# Patient Record
Sex: Female | Born: 1970 | Race: Black or African American | Hispanic: No | Smoking: Never smoker
Health system: Southern US, Community
[De-identification: ages and names within clinical notes are randomized; demographics above are authoritative.]

## PROBLEM LIST (undated history)

## (undated) DIAGNOSIS — M1990 Unspecified osteoarthritis, unspecified site: Secondary | ICD-10-CM

## (undated) DIAGNOSIS — K5900 Constipation, unspecified: Secondary | ICD-10-CM

## (undated) DIAGNOSIS — R42 Dizziness and giddiness: Secondary | ICD-10-CM

## (undated) DIAGNOSIS — F419 Anxiety disorder, unspecified: Secondary | ICD-10-CM

## (undated) DIAGNOSIS — R6889 Other general symptoms and signs: Secondary | ICD-10-CM

## (undated) DIAGNOSIS — G43909 Migraine, unspecified, not intractable, without status migrainosus: Secondary | ICD-10-CM

## (undated) DIAGNOSIS — G5600 Carpal tunnel syndrome, unspecified upper limb: Secondary | ICD-10-CM

## (undated) DIAGNOSIS — R110 Nausea: Secondary | ICD-10-CM

## (undated) DIAGNOSIS — R002 Palpitations: Secondary | ICD-10-CM

## (undated) DIAGNOSIS — R609 Edema, unspecified: Secondary | ICD-10-CM

## (undated) DIAGNOSIS — M722 Plantar fascial fibromatosis: Secondary | ICD-10-CM

## (undated) DIAGNOSIS — K219 Gastro-esophageal reflux disease without esophagitis: Secondary | ICD-10-CM

## (undated) DIAGNOSIS — M549 Dorsalgia, unspecified: Secondary | ICD-10-CM

## (undated) DIAGNOSIS — E7800 Pure hypercholesterolemia, unspecified: Secondary | ICD-10-CM

## (undated) DIAGNOSIS — M21969 Unspecified acquired deformity of unspecified lower leg: Secondary | ICD-10-CM

## (undated) HISTORY — DX: Edema, unspecified: R60.9

## (undated) HISTORY — DX: Pure hypercholesterolemia, unspecified: E78.00

## (undated) HISTORY — DX: Other general symptoms and signs: R68.89

## (undated) HISTORY — PX: ABDOMINAL HYSTERECTOMY: SHX81

## (undated) HISTORY — DX: Palpitations: R00.2

## (undated) HISTORY — DX: Dorsalgia, unspecified: M54.9

## (undated) HISTORY — DX: Dizziness and giddiness: R42

## (undated) HISTORY — DX: Anxiety disorder, unspecified: F41.9

## (undated) HISTORY — DX: Constipation, unspecified: K59.00

## (undated) HISTORY — DX: Gastro-esophageal reflux disease without esophagitis: K21.9

## (undated) HISTORY — DX: Nausea: R11.0

---

## 2006-09-11 ENCOUNTER — Emergency Department (HOSPITAL_COMMUNITY): Admission: EM | Admit: 2006-09-11 | Discharge: 2006-09-11 | Payer: Self-pay | Admitting: Emergency Medicine

## 2010-12-09 ENCOUNTER — Emergency Department (INDEPENDENT_AMBULATORY_CARE_PROVIDER_SITE_OTHER): Payer: Self-pay

## 2010-12-09 ENCOUNTER — Emergency Department (HOSPITAL_BASED_OUTPATIENT_CLINIC_OR_DEPARTMENT_OTHER)
Admission: EM | Admit: 2010-12-09 | Discharge: 2010-12-09 | Disposition: A | Discharge status: Home / Self Care | Payer: Self-pay | Attending: Emergency Medicine | Admitting: Emergency Medicine

## 2010-12-09 ENCOUNTER — Other Ambulatory Visit: Payer: Self-pay

## 2010-12-09 ENCOUNTER — Encounter: Payer: Self-pay | Admitting: *Deleted

## 2010-12-09 DIAGNOSIS — R079 Chest pain, unspecified: Secondary | ICD-10-CM

## 2010-12-09 LAB — BASIC METABOLIC PANEL
BUN: 11 mg/dL (ref 6–23)
CO2: 23 mEq/L (ref 19–32)
Calcium: 9.4 mg/dL (ref 8.4–10.5)
GFR calc non Af Amer: >60 mL/min (ref >60)
Glucose, Bld: 89 mg/dL (ref 70–99)
Potassium: 3.9 mEq/L (ref 3.5–5.1)
Sodium: 138 mEq/L (ref 135–145)

## 2010-12-09 LAB — CBC
Hemoglobin: 12.3 g/dL (ref 12.0–15.0)
MCH: 27.2 pg (ref 26.0–34.0)
MCV: 81.5 fL (ref 78.0–100.0)
RBC: 4.53 MIL/uL (ref 3.87–5.11)
WBC: 7.4 10*3/uL (ref 4.0–10.5)

## 2010-12-09 LAB — DIFFERENTIAL
Eosinophils Absolute: 0.2 10*3/uL (ref 0.0–0.7)
Eosinophils Relative: 3 % (ref 0–5)
Lymphocytes Relative: 34 % (ref 12–46)
Lymphs Abs: 2.5 10*3/uL (ref 0.7–4.0)
Monocytes Relative: 9 % (ref 3–12)

## 2010-12-09 LAB — CK TOTAL AND CKMB (NOT AT ARMC)
CK, MB: 3.1 ng/mL (ref 0.3–4.0)
Total CK: 203 U/L — ABNORMAL HIGH (ref 7–177)

## 2010-12-09 NOTE — ED Provider Notes (Signed)
Medical screening examination/treatment/procedure(s) were performed by non-physician practitioner and as supervising physician I was immediately available for consultation/collaboration.   Geoffery Lyons, MD 12/09/10 2329

## 2010-12-09 NOTE — ED Notes (Signed)
Chest pain x 3 days. Pain comes and goes. Sometimes it is in her right chest then it goes to her left chest. Pain has been in both arms but not at the same time. States she has been taking diet pills and has been under a lot of stress.

## 2010-12-09 NOTE — ED Provider Notes (Signed)
History     CSN: [id number] Arrival date & time: 12/09/2010  9:08 PM  Chief Complaint  Patient presents with  . Chest Pain   HPI Comments: Pt states that the pain has varied in location from the right chest to left chest to back am bilateral upper arms intermittent:pain was not radiating, wherever it was at the time was only in that location:pt states that she has been using her phentermine consistently over the last week which she doesn't normally do, she takes it intermittently;pt states that she also has been under a lot of stress with her so  Patient is a 40 y.o. female presenting with chest pain. The history is provided by the patient. No language interpreter was used.  Chest Pain The chest pain began 3 - 5 days ago. Chest pain occurs intermittently. The chest pain is unchanged. The pain is associated with stress. The severity of the pain is moderate. The quality of the pain is described as aching. The pain does not radiate. Chest pain is worsened by stress and deep breathing. Pertinent negatives for primary symptoms include no fever, no cough, no palpitations, no abdominal pain, no vomiting and no dizziness.  Pertinent negatives for associated symptoms include no near-syncope. She tried aspirin for the symptoms.     History reviewed. No pertinent past medical history.  Past Surgical History  Procedure Date  . Abdominal hysterectomy     No family history on file.  History  Substance Use Topics  . Smoking status: Never Smoker   . Smokeless tobacco: Not on file  . Alcohol Use: No    OB History    Grav Para Term Preterm Abortions TAB SAB Ect Mult Living                  Review of Systems  Constitutional: Negative for fever.  Respiratory: Negative for cough.   Cardiovascular: Positive for chest pain. Negative for palpitations and near-syncope.  Gastrointestinal: Negative for vomiting and abdominal pain.  Neurological: Negative for dizziness.  All other systems reviewed  and are negative.    Physical Exam  BP 137/95  Pulse 72  Temp(Src) 97.7 F (36.5 C) (Oral)  Resp 22  SpO2 99%  Physical Exam  Nursing note and vitals reviewed. Constitutional: She is oriented to person, place, and time. She appears well-developed and well-nourished.  HENT:  Head: Normocephalic and atraumatic.  Eyes: Pupils are equal, round, and reactive to light.  Neck: Normal range of motion. Neck supple.  Cardiovascular: Normal rate and regular rhythm.   Pulmonary/Chest: Effort normal and breath sounds normal.  Abdominal: Soft. Bowel sounds are normal.  Musculoskeletal: Normal range of motion.  Neurological: She is alert and oriented to person, place, and time.  Skin: Skin is warm and dry.  Psychiatric: She has a normal mood and affect.    ED Course  Procedures  Results for orders placed during the hospital encounter of 12/09/10  CBC      Component Value Range   WBC 7.4  4.0 - 10.5 (K/uL)   RBC 4.53  3.87 - 5.11 (MIL/uL)   Hemoglobin 12.3  12.0 - 15.0 (g/dL)   HCT 56.2  13.0 - 86.5 (%)   MCV 81.5  78.0 - 100.0 (fL)   MCH 27.2  26.0 - 34.0 (pg)   MCHC 33.3  30.0 - 36.0 (g/dL)   RDW 78.4  69.6 - 29.5 (%)   Platelets 301  150 - 400 (K/uL)  DIFFERENTIAL  Component Value Range   Neutrophils Relative 53  43 - 77 (%)   Neutro Abs 3.9  1.7 - 7.7 (K/uL)   Lymphocytes Relative 34  12 - 46 (%)   Lymphs Abs 2.5  0.7 - 4.0 (K/uL)   Monocytes Relative 9  3 - 12 (%)   Monocytes Absolute 0.6  0.1 - 1.0 (K/uL)   Eosinophils Relative 3  0 - 5 (%)   Eosinophils Absolute 0.2  0.0 - 0.7 (K/uL)   Basophils Relative 1  0 - 1 (%)   Basophils Absolute 0.1  0.0 - 0.1 (K/uL)  BASIC METABOLIC PANEL      Component Value Range   Sodium 138  135 - 145 (mEq/L)   Potassium 3.9  3.5 - 5.1 (mEq/L)   Chloride 103  96 - 112 (mEq/L)   CO2 23  19 - 32 (mEq/L)   Glucose, Bld 89  70 - 99 (mg/dL)   BUN 11  6 - 23 (mg/dL)   Creatinine, Ser 1.61  0.50 - 1.10 (mg/dL)   Calcium 9.4  8.4 -  10.5 (mg/dL)   GFR calc non Af Amer >60  >60 (mL/min)   GFR calc Af Amer >60  >60 (mL/min)  TROPONIN I      Component Value Range   Troponin I <0.30  <0.30 (ng/mL)  CK TOTAL AND CKMB      Component Value Range   Total CK 203 (*) 7 - 177 (U/L)   CK, MB 3.1  0.3 - 4.0 (ng/mL)   Relative Index 1.5  0.0 - 2.5    Dg Chest 2 View  12/09/2010  *RADIOLOGY REPORT*  Clinical Data: Left upper chest pain for 4 days.  CHEST - 2 VIEW  Comparison: None.  Findings: Heart size is normal.  The lungs are free of focal consolidations and pleural effusions.  No pulmonary edema. Visualized osseous structures have a normal appearance.  IMPRESSION: Negative exam.  Original Report Authenticated By: Patterson Hammersmith, M.D.    Date: 12/09/2010  Rate: 76  Rhythm: normal sinus rhythm  QRS Axis: normal  Intervals: normal  ST/T Wave abnormalities: normal  Conduction Disutrbances:none  Narrative Interpretation:   Old EKG Reviewed: non available    MDM Pt no hypoxic or tachycardic unlikely a pe:no sign of pneumonia on x-ray:pt has no family history:history not concerning for WRU:EAVW send pt home with follow up as need:told pt that phentermine can make symptoms worse:pts son is supposed to go court tomorrow       Teressa Lower, NP 12/09/10 2304

## 2010-12-11 ENCOUNTER — Emergency Department (HOSPITAL_BASED_OUTPATIENT_CLINIC_OR_DEPARTMENT_OTHER)
Admission: EM | Admit: 2010-12-11 | Discharge: 2010-12-11 | Disposition: A | Discharge status: Home / Self Care | Payer: Self-pay | Attending: Emergency Medicine | Admitting: Emergency Medicine

## 2010-12-11 ENCOUNTER — Encounter (HOSPITAL_BASED_OUTPATIENT_CLINIC_OR_DEPARTMENT_OTHER): Payer: Self-pay | Admitting: *Deleted

## 2010-12-11 DIAGNOSIS — T148XXA Other injury of unspecified body region, initial encounter: Secondary | ICD-10-CM

## 2010-12-11 DIAGNOSIS — Y849 Medical procedure, unspecified as the cause of abnormal reaction of the patient, or of later complication, without mention of misadventure at the time of the procedure: Secondary | ICD-10-CM | POA: Insufficient documentation

## 2010-12-11 DIAGNOSIS — S5010XA Contusion of unspecified forearm, initial encounter: Secondary | ICD-10-CM | POA: Insufficient documentation

## 2010-12-11 NOTE — ED Provider Notes (Signed)
History     CSN: [id number] Arrival date & time: 12/11/2010 11:15 PM  Chief Complaint  Patient presents with  . Arm Injury   Patient is a 40 y.o. female presenting with arm injury. The history is provided by the patient.  Arm Injury  Episode onset: 2 days ago, had an IV placed here for blood draw and  now has bruising in that area. Incident location: in the ER. The injury mechanism is unknown. There is an injury to the right forearm. The patient is experiencing no pain. It is unlikely that a foreign body is present. Pertinent negatives include no chest pain, no numbness, no abdominal pain, no headaches and no neck pain. There have been no prior injuries to these areas. Her tetanus status is UTD.   No redness or warmth, no N/V, no other complaints, was worried when she bruising.  History reviewed. No pertinent past medical history.  Past Surgical History  Procedure Date  . Abdominal hysterectomy     History reviewed. No pertinent family history.  History  Substance Use Topics  . Smoking status: Never Smoker   . Smokeless tobacco: Not on file  . Alcohol Use: No    OB History    Grav Para Term Preterm Abortions TAB SAB Ect Mult Living                  Review of Systems  Constitutional: Negative for fever and chills.  HENT: Negative for neck pain and neck stiffness.   Eyes: Negative for pain.  Respiratory: Negative for shortness of breath.   Cardiovascular: Negative for chest pain and leg swelling.  Gastrointestinal: Negative for abdominal pain.  Genitourinary: Negative for dysuria.  Musculoskeletal: Negative for back pain.  Skin: Negative for rash.  Neurological: Negative for numbness and headaches.  All other systems reviewed and are negative.    Physical Exam  BP 128/89  Pulse 90  Temp(Src) 98 F (36.7 C) (Oral)  Resp 16  Wt 212 lb (96.163 kg)  SpO2 100%  Physical Exam  Constitutional: She is oriented to person, place, and time. She appears well-developed and  well-nourished.  HENT:  Head: Normocephalic and atraumatic.  Eyes: Conjunctivae and EOM are normal. Pupils are equal, round, and reactive to light.  Neck: Trachea normal. Neck supple. No thyromegaly present.  Cardiovascular: Normal rate, regular rhythm, S1 normal, S2 normal and normal pulses.     No systolic murmur is present   No diastolic murmur is present  Pulses:      Radial pulses are 2+ on the right side, and 2+ on the left side.  Pulmonary/Chest: Effort normal and breath sounds normal. She has no wheezes. She has no rhonchi. She has no rales. She exhibits no tenderness.  Abdominal: Soft. Normal appearance and bowel sounds are normal. There is no tenderness. There is no CVA tenderness and negative Murphy's sign.  Musculoskeletal:       RUE: ecchymosis to proximal FA, no erythema or warmth or evidence of phlebitis otherwise. NTTP. Distal N/V intact. FROM throughout, no lymphadenopathy   Neurological: She is alert and oriented to person, place, and time. She has normal strength. No cranial nerve deficit or sensory deficit. GCS eye subscore is 4. GCS verbal subscore is 5. GCS motor subscore is 6.  Skin: Skin is warm and dry. No rash noted. She is not diaphoretic.  Psychiatric: Her speech is normal.       Cooperative and appropriate    ED Course  Procedures  MDM R FA ecchymosis s/p IV blood draw c/w bruising, not c/w phlebitis. Reassurance and instructions provided.       Sunnie Nielsen, MD 12/12/10 [phone]

## 2010-12-11 NOTE — ED Notes (Signed)
Pt seen here Monday and return today with bruise to right arm where iv was

## 2013-06-17 ENCOUNTER — Encounter (INDEPENDENT_AMBULATORY_CARE_PROVIDER_SITE_OTHER): Payer: Self-pay | Admitting: Ophthalmology

## 2013-07-08 ENCOUNTER — Encounter (INDEPENDENT_AMBULATORY_CARE_PROVIDER_SITE_OTHER): Payer: Managed Care, Other (non HMO) | Admitting: Ophthalmology

## 2013-07-08 DIAGNOSIS — G43919 Migraine, unspecified, intractable, without status migrainosus: Secondary | ICD-10-CM

## 2013-07-08 DIAGNOSIS — H43819 Vitreous degeneration, unspecified eye: Secondary | ICD-10-CM

## 2013-07-08 DIAGNOSIS — H3540 Unspecified peripheral retinal degeneration: Secondary | ICD-10-CM

## 2014-01-09 ENCOUNTER — Other Ambulatory Visit: Payer: Self-pay | Admitting: Internal Medicine

## 2014-01-09 DIAGNOSIS — N644 Mastodynia: Secondary | ICD-10-CM

## 2014-06-13 ENCOUNTER — Encounter: Payer: Self-pay | Admitting: Podiatry

## 2014-06-13 ENCOUNTER — Ambulatory Visit (INDEPENDENT_AMBULATORY_CARE_PROVIDER_SITE_OTHER): Payer: Managed Care, Other (non HMO) | Admitting: Podiatry

## 2014-06-13 VITALS — BP 124/78 | HR 76 | Ht 60.0 in | Wt 223.0 lb

## 2014-06-13 DIAGNOSIS — M21969 Unspecified acquired deformity of unspecified lower leg: Secondary | ICD-10-CM | POA: Insufficient documentation

## 2014-06-13 DIAGNOSIS — B351 Tinea unguium: Secondary | ICD-10-CM

## 2014-06-13 DIAGNOSIS — B353 Tinea pedis: Secondary | ICD-10-CM | POA: Insufficient documentation

## 2014-06-13 DIAGNOSIS — M722 Plantar fascial fibromatosis: Secondary | ICD-10-CM | POA: Diagnosis not present

## 2014-06-13 NOTE — Progress Notes (Signed)
Subjective: 44 year old female presents complaining of sharp pain in left heel in the morning or after been on feet long time, toe fungus, occasional athletic feet, sharp pain in big toe may be from fungal nail.  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   Review of Systems - General ROS: Feels cold or chill all the time. Ophthalmic ROS: Seeing white circles. Been checked out. Possible from Migranes. ENT ROS: negative Allergy and Immunology ROS: Hay fever. Hematological and Lymphatic ROS: negative Breast ROS: negative for breast lumps Respiratory ROS: no cough, shortness of breath, or wheezing Cardiovascular ROS: no chest pain or dyspnea on exertion Gastrointestinal ROS: no abdominal pain, change in bowel habits, or black or bloody stools Genito-Urinary ROS: no dysuria, trouble voiding, or hematuria Musculoskeletal ROS: Sore on thighs after been sitting from driving.  Neurological ROS: Hands get numb at times. Dermatological ROS: Dry feeling skin with fungal nails.    Objective: Dermatologic: Thick discolored nails x 10. Loose nail both great toes.  Hallux valgus bilateral. Hypermobile first ray bilateral L>R. STJ pronation bilateral.   Assessment: Onychomycosis x 10 with deformity. Hypermobile first ray with STJ hyperpronation bilateral. Bilateral bunion.  Plantar fasciitis left.  Tinea pedis mild.   Plan: Reviewed clinical findings and available treatment options. Night Splint dispensed. May give cortisone injection on next visit if needed. Need custom orthotics to reduce excess pronation. Use Salsun blue soap to scrub feet at the end of each shower. Return for orthotic.

## 2014-06-13 NOTE — Patient Instructions (Signed)
Seen for painful heel left foot. Night Splint dispensed. Need custom orthotics. Use Salsun blue soap to scrub feet at the end of each shower. Return for orthotic.

## 2014-06-27 ENCOUNTER — Ambulatory Visit: Payer: Managed Care, Other (non HMO) | Admitting: Podiatry

## 2014-07-07 ENCOUNTER — Ambulatory Visit: Payer: Managed Care, Other (non HMO) | Admitting: Podiatry

## 2014-08-26 ENCOUNTER — Encounter (HOSPITAL_BASED_OUTPATIENT_CLINIC_OR_DEPARTMENT_OTHER): Payer: Self-pay | Admitting: *Deleted

## 2014-08-26 ENCOUNTER — Emergency Department (HOSPITAL_BASED_OUTPATIENT_CLINIC_OR_DEPARTMENT_OTHER)
Admission: EM | Admit: 2014-08-26 | Discharge: 2014-08-26 | Disposition: A | Discharge status: Home / Self Care | Payer: Managed Care, Other (non HMO) | Attending: Emergency Medicine | Admitting: Emergency Medicine

## 2014-08-26 DIAGNOSIS — Z79899 Other long term (current) drug therapy: Secondary | ICD-10-CM | POA: Insufficient documentation

## 2014-08-26 DIAGNOSIS — K088 Other specified disorders of teeth and supporting structures: Secondary | ICD-10-CM | POA: Diagnosis not present

## 2014-08-26 DIAGNOSIS — K0889 Other specified disorders of teeth and supporting structures: Secondary | ICD-10-CM

## 2014-08-26 MED ORDER — AMOXICILLIN 500 MG PO CAPS: 500.0000 mg | ORAL_CAPSULE | Freq: Three times a day (TID) | ORAL | Status: DC

## 2014-08-26 MED ORDER — HYDROCODONE-ACETAMINOPHEN 5-325 MG PO TABS: ORAL_TABLET | ORAL | Status: DC

## 2014-08-26 MED ORDER — AMOXICILLIN 500 MG PO CAPS
500.0000 mg | ORAL_CAPSULE | Freq: Once | ORAL | Status: AC
  Administered 2014-08-26: 500 mg via ORAL
  Filled 2014-08-26: qty 1

## 2014-08-26 NOTE — ED Notes (Signed)
Right upper dental pain x1 month.

## 2014-08-26 NOTE — Discharge Instructions (Signed)
Take vicodin for breakthrough pain, do not drink alcohol, drive, care for children or do other critical tasks while taking vicodin.  Return to the emergency room for fever, change in vision, redness to the face that rapidly spreads towards the eye, nausea or vomiting, difficulty swallowing or shortness of breath.  Take your antibiotics as directed and to the end of the course.   Followup with a dentist is very important for ongoing evaluation and management of recurrent dental pain. Return to emergency department for emergent changing or worsening symptoms."    Dental Pain A tooth ache may be caused by cavities (tooth decay). Cavities expose the nerve of the tooth to air and hot or cold temperatures. It may come from an infection or abscess (also called a boil or furuncle) around your tooth. It is also often caused by dental caries (tooth decay). This causes the pain you are having. DIAGNOSIS  Your caregiver can diagnose this problem by exam. TREATMENT   If caused by an infection, it may be treated with medications which kill germs (antibiotics) and pain medications as prescribed by your caregiver. Take medications as directed.  Only take over-the-counter or prescription medicines for pain, discomfort, or fever as directed by your caregiver.  Whether the tooth ache today is caused by infection or dental disease, you should see your dentist as soon as possible for further care. SEEK MEDICAL CARE IF: The exam and treatment you received today has been provided on an emergency basis only. This is not a substitute for complete medical or dental care. If your problem worsens or new problems (symptoms) appear, and you are unable to meet with your dentist, call or return to this location. SEEK IMMEDIATE MEDICAL CARE IF:   You have a fever.  You develop redness and swelling of your face, jaw, or neck.  You are unable to open your mouth.  You have severe pain uncontrolled by pain medicine. MAKE  SURE YOU:   Understand these instructions.  Will watch your condition.  Will get help right away if you are not doing well or get worse. Document Released: 03/24/2005 Document Revised: 06/16/2011 Document Reviewed: 11/10/2007 [facility] Patient Information 2015 [employer], [place]. This information is not intended to replace advice given to you by your health care provider. Make sure you discuss any questions you have with your health care provider.

## 2014-08-26 NOTE — ED Provider Notes (Signed)
CSN: [id number]     Arrival date & time 08/26/14  1846 History   First MD Initiated Contact with Patient 08/26/14 1917     Chief Complaint  Patient presents with  . Dental Pain     (Consider location/radiation/quality/duration/timing/severity/associated sxs/prior Treatment) HPI   Marissa Lin is a 44 y.o. female complaining of persistent pain to right upper jaw status post extraction approximately one month ago. Patient states that she is sensitive to cold, she saw her dentist, Dr. Clarene Duke last week where she had a tooth extracted on the bottom, states she's feeling well from this. She rates her pain at 8 out of 10, exacerbated by hot and cold. She's taking tramadol home with little relief. She denies fever, chills, facial swelling, difficulty swallowing.   History reviewed. No pertinent past medical history. Past Surgical History  Procedure Laterality Date  . Abdominal hysterectomy     No family history on file. History  Substance Use Topics  . Smoking status: Never Smoker   . Smokeless tobacco: Never Used  . Alcohol Use: No   OB History    No data available     Review of Systems  10 systems reviewed and found to be negative, except as noted in the HPI.  Allergies  Fluconazole in dextrose  Home Medications   Prior to Admission medications   Medication Sig Start Date End Date Taking? Authorizing Provider  amoxicillin (AMOXIL) 500 MG capsule Take 1 capsule (500 mg total) by mouth 3 (three) times daily. 08/26/14   Marissa Kachel, PA-C  ASPIRIN BUFFERED PO Take 1 tablet by mouth once as needed. For chest pain     Historical Provider, MD  HYDROcodone-acetaminophen (NORCO/VICODIN) 5-325 MG per tablet Take 1-2 tablets by mouth every 6 hours as needed for pain and/or cough. 08/26/14   Marissa Finazzo, PA-C  phentermine 37.5 MG capsule Take 37.5 mg by mouth every morning.      Historical Provider, MD   BP 148/91 mmHg  Pulse 80  Temp(Src) 98.3 F (36.8 C) (Oral)   Resp 18  Ht 5' (1.524 m)  Wt 220 lb (99.791 kg)  BMI 42.97 kg/m2  SpO2 100% Physical Exam  Constitutional: She is oriented to person, place, and time. She appears well-developed and well-nourished. No distress.  HENT:  Head: Normocephalic.  Mouth/Throat: Uvula is midline and oropharynx is clear and moist. No trismus in the jaw. No uvula swelling. No oropharyngeal exudate, posterior oropharyngeal edema, posterior oropharyngeal erythema or tonsillar abscesses.  Generally poor dentition, no gingival swelling, erythema or tenderness to palpation. Site of extraction is healed nicely, there is trace superficial erythema to the site, no fluctuance or significant tenderness to palpation. Patient is handling their secretions. There is no tenderness to palpation or firmness underneath tongue bilaterally. No trismus.    Eyes: Conjunctivae and EOM are normal.  Cardiovascular: Normal rate.   Pulmonary/Chest: Effort normal. No stridor.  Musculoskeletal: Normal range of motion.  Lymphadenopathy:    She has no cervical adenopathy.  Neurological: She is alert and oriented to person, place, and time.  Psychiatric: She has a normal mood and affect.  Nursing note and vitals reviewed.   ED Course  Procedures (including critical care time) Labs Review Labs Reviewed - No data to display  Imaging Review No results found.   EKG Interpretation None      MDM   Final diagnoses:  Pain, dental    Filed Vitals:   08/26/14 1855  BP: 148/91  Pulse: 80  Temp:  98.3 F (36.8 C)  TempSrc: Oral  Resp: 18  Height: 5' (1.524 m)  Weight: 220 lb (99.791 kg)  SpO2: 100%    Medications  amoxicillin (AMOXIL) capsule 500 mg (not administered)    Marissa Lin is a pleasant 44 y.o. female presenting with persistent dental discomfort after tooth was extracted approximately one month ago. There is no overt signs of infection, is healed well., Will start on amoxicillin for possible early  infection. Advised her to follow closely with her dentist.  Evaluation does not show pathology that would require ongoing emergent intervention or inpatient treatment. Pt is hemodynamically stable and mentating appropriately. Discussed findings and plan with patient/guardian, who agrees with care plan. All questions answered. Return precautions discussed and outpatient follow up given.   New Prescriptions   AMOXICILLIN (AMOXIL) 500 MG CAPSULE    Take 1 capsule (500 mg total) by mouth 3 (three) times daily.   HYDROCODONE-ACETAMINOPHEN (NORCO/VICODIN) 5-325 MG PER TABLET    Take 1-2 tablets by mouth every 6 hours as needed for pain and/or cough.         Marissa Emeryicole Raffael Bugarin, PA-C 08/26/14 1935  Arby BarretteMarcy Pfeiffer, MD 08/29/14 1435

## 2015-02-09 ENCOUNTER — Emergency Department (HOSPITAL_BASED_OUTPATIENT_CLINIC_OR_DEPARTMENT_OTHER): Payer: Managed Care, Other (non HMO)

## 2015-02-09 ENCOUNTER — Emergency Department (HOSPITAL_BASED_OUTPATIENT_CLINIC_OR_DEPARTMENT_OTHER)
Admission: EM | Admit: 2015-02-09 | Discharge: 2015-02-09 | Disposition: A | Discharge status: Home / Self Care | Payer: Managed Care, Other (non HMO) | Attending: Emergency Medicine | Admitting: Emergency Medicine

## 2015-02-09 ENCOUNTER — Encounter (HOSPITAL_BASED_OUTPATIENT_CLINIC_OR_DEPARTMENT_OTHER): Payer: Self-pay | Admitting: *Deleted

## 2015-02-09 DIAGNOSIS — Z792 Long term (current) use of antibiotics: Secondary | ICD-10-CM | POA: Diagnosis not present

## 2015-02-09 DIAGNOSIS — Z79899 Other long term (current) drug therapy: Secondary | ICD-10-CM | POA: Diagnosis not present

## 2015-02-09 DIAGNOSIS — S6992XA Unspecified injury of left wrist, hand and finger(s), initial encounter: Secondary | ICD-10-CM | POA: Insufficient documentation

## 2015-02-09 DIAGNOSIS — S3992XA Unspecified injury of lower back, initial encounter: Secondary | ICD-10-CM | POA: Diagnosis not present

## 2015-02-09 DIAGNOSIS — W1839XA Other fall on same level, initial encounter: Secondary | ICD-10-CM | POA: Insufficient documentation

## 2015-02-09 DIAGNOSIS — F419 Anxiety disorder, unspecified: Secondary | ICD-10-CM | POA: Insufficient documentation

## 2015-02-09 DIAGNOSIS — Y998 Other external cause status: Secondary | ICD-10-CM | POA: Insufficient documentation

## 2015-02-09 DIAGNOSIS — Y9389 Activity, other specified: Secondary | ICD-10-CM | POA: Insufficient documentation

## 2015-02-09 DIAGNOSIS — M79645 Pain in left finger(s): Secondary | ICD-10-CM

## 2015-02-09 DIAGNOSIS — R21 Rash and other nonspecific skin eruption: Secondary | ICD-10-CM | POA: Insufficient documentation

## 2015-02-09 DIAGNOSIS — M549 Dorsalgia, unspecified: Secondary | ICD-10-CM

## 2015-02-09 DIAGNOSIS — Y9289 Other specified places as the place of occurrence of the external cause: Secondary | ICD-10-CM | POA: Insufficient documentation

## 2015-02-09 MED ORDER — TRIAMCINOLONE ACETONIDE 0.1 % EX CREA: 1.0000 "application " | TOPICAL_CREAM | Freq: Two times a day (BID) | CUTANEOUS | Status: DC

## 2015-02-09 MED ORDER — METHOCARBAMOL 500 MG PO TABS: 500.0000 mg | ORAL_TABLET | Freq: Two times a day (BID) | ORAL | Status: DC

## 2015-02-09 MED ORDER — NAPROXEN 500 MG PO TABS: 500.0000 mg | ORAL_TABLET | Freq: Two times a day (BID) | ORAL | Status: DC

## 2015-02-09 NOTE — Discharge Instructions (Signed)
Take the prescribed medication as directed. °Follow-up with your primary care physician. °Return to the ED for new or worsening symptoms. ° °

## 2015-02-09 NOTE — ED Notes (Signed)
Pt c/o bilateral lower back pain, "bumps" on bilateral cheeks with itching and left thumb pain.  Pt additionally states her right arm intermittently "goes numb" for past few months.

## 2015-02-09 NOTE — ED Notes (Signed)
Back pain x 2 weeks. She fell and had an injury to her left thumb a few weeks ago. Pain continues. Her face itches and she feels like something is crawling on her face.

## 2015-02-09 NOTE — ED Notes (Signed)
Patient transported to X-ray 

## 2015-02-09 NOTE — ED Provider Notes (Signed)
CSN: [id number]     Arrival date & time 02/09/15  2005 History   First MD Initiated Contact with Patient 02/09/15 2057     Chief Complaint  Patient presents with  . Back Pain     (Consider location/radiation/quality/duration/timing/severity/associated sxs/prior Treatment) Patient is a 44 y.o. female presenting with back pain. The history is provided by the patient and medical records.  Back Pain   44 year old female with no significant past medical history presenting to the ED for multiple complaints. Patient states she fell proximally 2-3 weeks ago onto her low back and left thumb. She states she has continued to have pain in these areas. States she feels there is a "catch" in her low back. She denies numbness or weakness of her lower extremities. No loss of bowel or bladder control. Patient also complains of a pruritic rash of her cheeks. She states this waxes and wanes. She denies any new soap, detergent, makeup, or other personal care products. She states she does have dry skin. No history of eczema. She states she feels as though there is "something crawling on her face". She denies any fever or chills. No facial/oral swelling or difficulty swallowing.  History reviewed. No pertinent past medical history. Past Surgical History  Procedure Laterality Date  . Abdominal hysterectomy     No family history on file. Social History  Substance Use Topics  . Smoking status: Never Smoker   . Smokeless tobacco: Never Used  . Alcohol Use: No   OB History    No data available     Review of Systems  Musculoskeletal: Positive for back pain and arthralgias.  Skin: Positive for rash.  All other systems reviewed and are negative.     Allergies  Fluconazole in dextrose  Home Medications   Prior to Admission medications   Medication Sig Start Date End Date Taking? Authorizing Provider  amoxicillin (AMOXIL) 500 MG capsule Take 1 capsule (500 mg total) by mouth 3 (three) times daily.  08/26/14   Nicole Pisciotta, PA-C  ASPIRIN BUFFERED PO Take 1 tablet by mouth once as needed. For chest pain     Historical Provider, MD  HYDROcodone-acetaminophen (NORCO/VICODIN) 5-325 MG per tablet Take 1-2 tablets by mouth every 6 hours as needed for pain and/or cough. 08/26/14   Nicole Pisciotta, PA-C  phentermine 37.5 MG capsule Take 37.5 mg by mouth every morning.      Historical Provider, MD   BP 158/101 mmHg  Pulse 88  Temp(Src) 98 F (36.7 C) (Oral)  Resp 18  Ht 5' (1.524 m)  Wt 220 lb (99.791 kg)  BMI 42.97 kg/m2  SpO2 100%   Physical Exam  Constitutional: She is oriented to person, place, and time. She appears well-developed and well-nourished. No distress.  HENT:  Head: Normocephalic and atraumatic.  Mouth/Throat: Oropharynx is clear and moist.  No oral lesions, airway patent, handling secretions well  Eyes: Conjunctivae and EOM are normal. Pupils are equal, round, and reactive to light.  Neck: Normal range of motion.  Cardiovascular: Normal rate, regular rhythm and normal heart sounds.   Pulmonary/Chest: Effort normal and breath sounds normal. No respiratory distress. She has no wheezes.  Abdominal: Soft. Bowel sounds are normal.  Musculoskeletal: Normal range of motion.       Left hand: Normal. Normal sensation noted. Normal strength noted.  Endorses diffuse pain across lumbar spine without focal tenderness; no midline deformities or step-off; full ROM maintained; negative straight leg raise bilaterally; normal strength and sensation of bilateral  lower extremities, normal gait Reports left thumb "needs to pop"-- no deformities, non-tender, full ROM  Neurological: She is alert and oriented to person, place, and time.  Skin: Skin is warm and dry. Rash noted. She is not diaphoretic.  Few scattered pruritic areas noted to upper cheeks extending towards nose; no signs of superimposed infection or cellulitis  Psychiatric: Her mood appears anxious.  Appear anxious  Nursing  note and vitals reviewed.   ED Course  Procedures (including critical care time) Labs Review Labs Reviewed - No data to display  Imaging Review Dg Lumbar Spine Complete  02/09/2015  CLINICAL DATA:  Worsening low back pain for the past year. Fell 3 weeks ago. EXAM: LUMBAR SPINE - COMPLETE 4+ VIEW COMPARISON:  None. FINDINGS: Bilateral pelvic and left lower abdominal phleboliths. 5 non-rib-bearing lumbar vertebrae. Minimal anterior spur formation at the L4-5 level. No fractures, pars defects or subluxations. IMPRESSION: No fracture or subluxation. Minimal degenerative changes at the L4-5 level. Electronically Signed   By: Beckie Salts M.D.   On: 02/09/2015 22:03   I have personally reviewed and evaluated these images and lab results as part of my medical decision-making.   EKG Interpretation None      MDM   Final diagnoses:  Back pain, unspecified location  Thumb pain, left  Rash   44 year old female here for multiple complaints. She reports left thumb pain and low back pain after a fall 2 weeks ago. She has no noted deformities on exam. No focal neurologic deficits to suggest cauda equina.  X-rays were obtained which are negative for acute findings.  Will start her on Robaxin and Naprosyn. Patient also complains of pruritic rash on her face.  Does admit to dry skin.  Rash does not appear infectious and is not consistent with malar rash of lupus.  May be component of eczema given her dry skin.  Will start on trial of kenalog.  Patient to follow-up with PCP.  Discussed plan with patient, he/she acknowledged understanding and agreed with plan of care.  Return precautions given for new or worsening symptoms.  Garlon Hatchet, PA-C 02/09/15 2210  Linwood Dibbles, MD 02/09/15 2216

## 2015-04-11 ENCOUNTER — Other Ambulatory Visit: Payer: Self-pay

## 2015-04-11 ENCOUNTER — Emergency Department (HOSPITAL_BASED_OUTPATIENT_CLINIC_OR_DEPARTMENT_OTHER): Payer: Managed Care, Other (non HMO)

## 2015-04-11 ENCOUNTER — Encounter (HOSPITAL_BASED_OUTPATIENT_CLINIC_OR_DEPARTMENT_OTHER): Payer: Self-pay | Admitting: *Deleted

## 2015-04-11 ENCOUNTER — Emergency Department (HOSPITAL_BASED_OUTPATIENT_CLINIC_OR_DEPARTMENT_OTHER)
Admission: EM | Admit: 2015-04-11 | Discharge: 2015-04-11 | Disposition: A | Discharge status: Home / Self Care | Payer: Managed Care, Other (non HMO) | Attending: Emergency Medicine | Admitting: Emergency Medicine

## 2015-04-11 DIAGNOSIS — N644 Mastodynia: Secondary | ICD-10-CM | POA: Diagnosis not present

## 2015-04-11 DIAGNOSIS — R002 Palpitations: Secondary | ICD-10-CM | POA: Diagnosis not present

## 2015-04-11 DIAGNOSIS — R079 Chest pain, unspecified: Secondary | ICD-10-CM | POA: Diagnosis not present

## 2015-04-11 LAB — CBC WITH DIFFERENTIAL/PLATELET
BASOS PCT: 1 %
Basophils Absolute: 0 10*3/uL (ref 0.0–0.1)
EOS ABS: 0.2 10*3/uL (ref 0.0–0.7)
EOS PCT: 3 %
HCT: 36.2 % (ref 36.0–46.0)
HEMOGLOBIN: 11.6 g/dL — AB (ref 12.0–15.0)
LYMPHS ABS: 1.9 10*3/uL (ref 0.7–4.0)
Lymphocytes Relative: 35 %
MCH: 26.4 pg (ref 26.0–34.0)
MCHC: 32 g/dL (ref 30.0–36.0)
MCV: 82.5 fL (ref 78.0–100.0)
MONO ABS: 0.5 10*3/uL (ref 0.1–1.0)
MONOS PCT: 10 %
NEUTROS PCT: 51 %
Neutro Abs: 2.7 10*3/uL (ref 1.7–7.7)
Platelets: 359 10*3/uL (ref 150–400)
RBC: 4.39 MIL/uL (ref 3.87–5.11)
RDW: 14.9 % (ref 11.5–15.5)
WBC: 5.3 10*3/uL (ref 4.0–10.5)

## 2015-04-11 LAB — MAGNESIUM: MAGNESIUM: 2.1 mg/dL (ref 1.7–2.4)

## 2015-04-11 LAB — D-DIMER, QUANTITATIVE: D-Dimer, Quant: 0.31 ug{FEU}/mL (ref 0.00–0.50)

## 2015-04-11 LAB — PHOSPHORUS: Phosphorus: 3.5 mg/dL (ref 2.5–4.6)

## 2015-04-11 LAB — TROPONIN I: Troponin I: <0.03 ng/mL (ref <0.031)

## 2015-04-11 LAB — TSH: TSH: 2.851 u[IU]/mL (ref 0.350–4.500)

## 2015-04-11 NOTE — ED Notes (Signed)
MD at bedside. 

## 2015-04-11 NOTE — Discharge Instructions (Signed)
Follow-up with your PCP. We did not find serious cause of your symptoms today. Return for worsening symptoms, including worsening pain, difficulty breathing, passing out, or any other symptoms concerning to you. Cut down on caffeine to see if symptoms improve.  Palpitations A palpitation is the feeling that your heartbeat is irregular or is faster than normal. It may feel like your heart is fluttering or skipping a beat. Palpitations are usually not a serious problem. However, in some cases, you may need further medical evaluation. CAUSES  Palpitations can be caused by:  Smoking.  Caffeine or other stimulants, such as diet pills or energy drinks.  Alcohol.  Stress and anxiety.  Strenuous physical activity.  Fatigue.  Certain medicines.  Heart disease, especially if you have a history of irregular heart rhythms (arrhythmias), such as atrial fibrillation, atrial flutter, or supraventricular tachycardia.  An improperly working pacemaker or defibrillator. DIAGNOSIS  To find the cause of your palpitations, your health care provider will take your medical history and perform a physical exam. Your health care provider may also have you take a test called an ambulatory electrocardiogram (ECG). An ECG records your heartbeat patterns over a 24-hour period. You may also have other tests, such as:  Transthoracic echocardiogram (TTE). During echocardiography, sound waves are used to evaluate how blood flows through your heart.  Transesophageal echocardiogram (TEE).  Cardiac monitoring. This allows your health care provider to monitor your heart rate and rhythm in real time.  Holter monitor. This is a portable device that records your heartbeat and can help diagnose heart arrhythmias. It allows your health care provider to track your heart activity for several days, if needed.  Stress tests by exercise or by giving medicine that makes the heart beat faster. TREATMENT  Treatment of palpitations  depends on the cause of your symptoms and can vary greatly. Most cases of palpitations do not require any treatment other than time, relaxation, and monitoring your symptoms. Other causes, such as atrial fibrillation, atrial flutter, or supraventricular tachycardia, usually require further treatment. HOME CARE INSTRUCTIONS   Avoid:  Caffeinated coffee, tea, soft drinks, diet pills, and energy drinks.  Chocolate.  Alcohol.  Stop smoking if you smoke.  Reduce your stress and anxiety. Things that can help you relax include:  A method of controlling things in your body, such as your heartbeats, with your mind (biofeedback).  Yoga.  Meditation.  Physical activity such as swimming, jogging, or walking.  Get plenty of rest and sleep. SEEK MEDICAL CARE IF:   You continue to have a fast or irregular heartbeat beyond 24 hours.  Your palpitations occur more often. SEEK IMMEDIATE MEDICAL CARE IF:  You have chest pain or shortness of breath.  You have a severe headache.  You feel dizzy or you faint. MAKE SURE YOU:  Understand these instructions.  Will watch your condition.  Will get help right away if you are not doing well or get worse.   This information is not intended to replace advice given to you by your health care provider. Make sure you discuss any questions you have with your health care provider.   Document Released: 03/21/2000 Document Revised: 03/29/2013 Document Reviewed: 05/23/2011 Elsevier Interactive Patient Education [employer]2016 Elsevier Inc.

## 2015-04-11 NOTE — ED Notes (Addendum)
C/o rapid hrt beat during the night and sharp pain in left breast x 2 weeks. States she has intermittant numbness of bil arms. No other sx. Pt states she has recently taken prescription diet pills.

## 2015-04-11 NOTE — ED Provider Notes (Signed)
CSN: [id number]     Arrival date & time 04/11/15  0901 History   First MD Initiated Contact with Patient 04/11/15 [phone]     No chief complaint on file.    (Consider location/radiation/quality/duration/timing/severity/associated sxs/prior Treatment) HPI 45 year old female who presents with palpitations and chest pain.  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 2 weeks of sharp left breast pain, missed mammogram last year, not fully reproducible but notes this with increased stresses and feels a sharp pain in her breast lasts for a few seconds and goes away when she thinks about stressors While sleeping, fingers feels numb bilaterally and on cell phone, but goes away when she changes position   Got over a cold this weekend, cough, sneezing, congestions. No fever or chills No sob, lightheadness or syncope States for the holidays having more caffeinated drinks.   History of partial hysterectomy no exogenous hormones Mother, brother, and father with history of PE/DVT.  History reviewed. No pertinent past medical history. Past Surgical History  Procedure Laterality Date  . Abdominal hysterectomy     History reviewed. No pertinent family history. Social History  Substance Use Topics  . Smoking status: Never Smoker   . Smokeless tobacco: Never Used  . Alcohol Use: No   OB History    No data available     Review of Systems 10/14 systems reviewed and are negative other than those stated in the HPI    Allergies  Fluconazole in dextrose  Home Medications   Prior to Admission medications   Not on File   BP 113/78 mmHg  Pulse 73  Temp(Src) 97.7 F (36.5 C) (Oral)  Resp 14  Ht 5' (1.524 m)  Wt 230 lb (104.327 kg)  BMI 44.92 kg/m2  SpO2 100% Physical Exam Physical Exam  Nursing note and vitals  reviewed. Constitutional: Well developed, well nourished, non-toxic, and in no acute distress Head: Normocephalic and atraumatic.  Mouth/Throat: Oropharynx is clear and moist.  Neck: Normal range of motion. Neck supple.  Cardiovascular: Normal rate and regular rhythm.  No edema. Pulmonary/Chest: Effort normal and breath sounds normal. no chest wall or breast tenderness. Abdominal: Soft. There is no tenderness. There is no rebound and no guarding.  Musculoskeletal: Normal range of motion.  Neurological: Alert, no facial droop, fluent speech, moves all extremities symmetrically Skin: Skin is warm and dry.  Psychiatric: Cooperative  ED Course  Procedures (including critical care time) Labs Review Labs Reviewed  CBC WITH DIFFERENTIAL/PLATELET - Abnormal; Notable for the following:    Hemoglobin 11.6 (*)    All other components within normal limits  MAGNESIUM  TSH  PHOSPHORUS  TROPONIN I  D-DIMER, QUANTITATIVE (NOT AT [facility])  TROPONIN I    Imaging Review Dg Chest 2 View  04/11/2015  CLINICAL DATA:  Chest pain. Rapid heart beat. Intermittent bilateral arm numbness. EXAM: CHEST  2 VIEW COMPARISON:  12/09/2010 FINDINGS: The cardiomediastinal silhouette is within normal limits. The lungs are well inflated and clear. There is no evidence of pleural effusion or pneumothorax. No acute osseous abnormality is identified. IMPRESSION: No active cardiopulmonary disease. Electronically Signed   By: Sebastian Ache M.D.   On: 04/11/2015 10:42   I have personally reviewed and evaluated these images and lab results as part of my medical decision-making.   EKG Interpretation   Date/Time:  Wednesday April 11 2015 09:19:34  EST Ventricular Rate:  69 PR Interval:  154 QRS Duration: 78 QT Interval:  412 QTC Calculation: 441 R Axis:   35 Text Interpretation:  Normal sinus rhythm Normal ECG ED PHYSICIAN  INTERPRETATION AVAILABLE IN [facility] Confirmed by TEST, Record  ([id number]) on 04/12/2015 6:35:21  AM      MDM   Final diagnoses:  Palpitations  Chest pain, unspecified chest pain type    45 year old female who presents with intermittent palpitations in the setting of increased this caffeine intake. Also with intermittent sharp and brief chest pains in the setting of increased stressors. Is well-appearing and asymptomatic on arrival. Vital signs are stable. Exam is overall unremarkable. No major electrolyte or metabolic derangements are noted. 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 D-dimer is negative and ruled out for PE. Serial troponins are negative and EKG is nonischemic. She is low risk for ACS, and is felt to be adequately ruled out. Pain is also atypical for that of ACS. No other suspicion for serious or life-threatening causes of her symptoms. Palpitations likely from increased caffeine intake, and she is asked to decrease her caffeine intake to see if this would make a difference. She is to follow-up with her PCP to reschedule mammogram and for any additional outpatient workup for her symptoms. Strict return and follow-up instructions are reviewed. She expressed understanding of all discharge instructions and felt comfortable to plan of care   Lavera Guiseana Duo Marcille Barman, MD 04/12/15 [phone]

## 2015-04-11 NOTE — ED Notes (Signed)
Follow-up care discussed with pt. No new rx given

## 2015-04-27 ENCOUNTER — Encounter (HOSPITAL_BASED_OUTPATIENT_CLINIC_OR_DEPARTMENT_OTHER): Payer: Self-pay | Admitting: Emergency Medicine

## 2015-04-27 ENCOUNTER — Emergency Department (HOSPITAL_BASED_OUTPATIENT_CLINIC_OR_DEPARTMENT_OTHER)
Admission: EM | Admit: 2015-04-27 | Discharge: 2015-04-27 | Disposition: A | Discharge status: Home / Self Care | Payer: Managed Care, Other (non HMO) | Attending: Emergency Medicine | Admitting: Emergency Medicine

## 2015-04-27 DIAGNOSIS — H81391 Other peripheral vertigo, right ear: Secondary | ICD-10-CM | POA: Diagnosis not present

## 2015-04-27 DIAGNOSIS — R0981 Nasal congestion: Secondary | ICD-10-CM | POA: Insufficient documentation

## 2015-04-27 DIAGNOSIS — Z792 Long term (current) use of antibiotics: Secondary | ICD-10-CM | POA: Insufficient documentation

## 2015-04-27 DIAGNOSIS — Z8739 Personal history of other diseases of the musculoskeletal system and connective tissue: Secondary | ICD-10-CM | POA: Diagnosis not present

## 2015-04-27 DIAGNOSIS — J3489 Other specified disorders of nose and nasal sinuses: Secondary | ICD-10-CM | POA: Diagnosis not present

## 2015-04-27 DIAGNOSIS — R42 Dizziness and giddiness: Secondary | ICD-10-CM | POA: Diagnosis present

## 2015-04-27 HISTORY — DX: Unspecified osteoarthritis, unspecified site: M19.90

## 2015-04-27 MED ORDER — MOMETASONE FUROATE 50 MCG/ACT NA SUSP: 2.0000 | Freq: Every day | NASAL | Status: DC

## 2015-04-27 MED ORDER — LORATADINE 10 MG PO TABS: 10.0000 mg | ORAL_TABLET | Freq: Every day | ORAL | Status: DC

## 2015-04-27 MED ORDER — LORATADINE 10 MG PO TABS
10.0000 mg | ORAL_TABLET | Freq: Once | ORAL | Status: AC
  Administered 2015-04-27: 10 mg via ORAL
  Filled 2015-04-27: qty 1

## 2015-04-27 MED ORDER — OXYMETAZOLINE HCL 0.05 % NA SOLN
1.0000 | Freq: Once | NASAL | Status: AC
  Administered 2015-04-27: 1 via NASAL
  Filled 2015-04-27: qty 15

## 2015-04-27 MED ORDER — IBUPROFEN 400 MG PO TABS
600.0000 mg | ORAL_TABLET | Freq: Once | ORAL | Status: AC
  Administered 2015-04-27: 600 mg via ORAL
  Filled 2015-04-27: qty 1

## 2015-04-27 MED ORDER — OXYMETAZOLINE HCL 0.05 % NA SOLN: 2.0000 | Freq: Two times a day (BID) | NASAL | Status: DC

## 2015-04-27 MED FILL — NASAL DECONGESTANT 0.05% SP: 0.05 | 15 days supply | Qty: 15 | Fill #0

## 2015-04-27 MED FILL — LORATADINE 10 MG TABLET: 10 | 100 days supply | Qty: 100 | Fill #0

## 2015-04-27 NOTE — Discharge Instructions (Signed)
Benign Positional Vertigo Vertigo is the feeling that you or your surroundings are moving when they are not. Benign positional vertigo is the most common form of vertigo. The cause of this condition is not serious (is benign). This condition is triggered by certain movements and positions (is positional). This condition can be dangerous if it occurs while you are doing something that could endanger you or others, such as driving.  CAUSES In many cases, the cause of this condition is not known. It may be caused by a disturbance in an area of the inner ear that helps your brain to sense movement and balance. This disturbance can be caused by a viral infection (labyrinthitis), head injury, or repetitive motion. RISK FACTORS This condition is more likely to develop in:  Women.  People who are 50 years of age or older. SYMPTOMS Symptoms of this condition usually happen when you move your head or your eyes in different directions. Symptoms may start suddenly, and they usually last for less than a minute. Symptoms may include:  Loss of balance and falling.  Feeling like you are spinning or moving.  Feeling like your surroundings are spinning or moving.  Nausea and vomiting.  Blurred vision.  Dizziness.  Involuntary eye movement (nystagmus). Symptoms can be mild and cause only slight annoyance, or they can be severe and interfere with daily life. Episodes of benign positional vertigo may return (recur) over time, and they may be triggered by certain movements. Symptoms may improve over time. DIAGNOSIS This condition is usually diagnosed by medical history and a physical exam of the head, neck, and ears. You may be referred to a health care provider who specializes in ear, nose, and throat (ENT) problems (otolaryngologist) or a provider who specializes in disorders of the nervous system (neurologist). You may have additional testing, including:  MRI.  A CT scan.  Eye movement tests. Your  health care provider may ask you to change positions quickly while he or she watches you for symptoms of benign positional vertigo, such as nystagmus. Eye movement may be tested with an electronystagmogram (ENG), caloric stimulation, the Dix-Hallpike test, or the roll test.  An electroencephalogram (EEG). This records electrical activity in your brain.  Hearing tests. TREATMENT Usually, your health care provider will treat this by moving your head in specific positions to adjust your inner ear back to normal. Surgery may be needed in severe cases, but this is rare. In some cases, benign positional vertigo may resolve on its own in 2-4 weeks. HOME CARE INSTRUCTIONS Safety  Move slowly.Avoid sudden body or head movements.  Avoid driving.  Avoid operating heavy machinery.  Avoid doing any tasks that would be dangerous to you or others if a vertigo episode would occur.  If you have trouble walking or keeping your balance, try using a cane for stability. If you feel dizzy or unstable, sit down right away.  Return to your normal activities as told by your health care provider. Ask your health care provider what activities are safe for you. General Instructions  Take over-the-counter and prescription medicines only as told by your health care provider.  Avoid certain positions or movements as told by your health care provider.  Drink enough fluid to keep your urine clear or pale yellow.  Keep all follow-up visits as told by your health care provider. This is important. SEEK MEDICAL CARE IF:  You have a fever.  Your condition gets worse or you develop new symptoms.  Your family or friends   notice any behavioral changes.  Your nausea or vomiting gets worse.  You have numbness or a "pins and needles" sensation. SEEK IMMEDIATE MEDICAL CARE IF:  You have difficulty speaking or moving.  You are always dizzy.  You faint.  You develop severe headaches.  You have weakness in your  legs or arms.  You have changes in your hearing or vision.  You develop a stiff neck.  You develop sensitivity to light.   This information is not intended to replace advice given to you by your health care provider. Make sure you discuss any questions you have with your health care provider.   Document Released: 12/30/2005 Document Revised: 12/13/2014 Document Reviewed: 07/17/2014 Elsevier Interactive Patient Education 2016 Elsevier Inc. Sinusitis, Adult Sinusitis is redness, soreness, and inflammation of the paranasal sinuses. Paranasal sinuses are air pockets within the bones of your face. They are located beneath your eyes, in the middle of your forehead, and above your eyes. In healthy paranasal sinuses, mucus is able to drain out, and air is able to circulate through them by way of your nose. However, when your paranasal sinuses are inflamed, mucus and air can become trapped. This can allow bacteria and other germs to grow and cause infection. Sinusitis can develop quickly and last only a short time (acute) or continue over a long period (chronic). Sinusitis that lasts for more than 12 weeks is considered chronic. CAUSES Causes of sinusitis include:  Allergies.  Structural abnormalities, such as displacement of the cartilage that separates your nostrils (deviated septum), which can decrease the air flow through your nose and sinuses and affect sinus drainage.  Functional abnormalities, such as when the small hairs (cilia) that line your sinuses and help remove mucus do not work properly or are not present. SIGNS AND SYMPTOMS Symptoms of acute and chronic sinusitis are the same. The primary symptoms are pain and pressure around the affected sinuses. Other symptoms include:  Upper toothache.  Earache.  Headache.  Bad breath.  Decreased sense of smell and taste.  A cough, which worsens when you are lying flat.  Fatigue.  Fever.  Thick drainage from your nose, which often  is green and may contain pus (purulent).  Swelling and warmth over the affected sinuses. DIAGNOSIS Your health care provider will perform a physical exam. During your exam, your health care provider may perform any of the following to help determine if you have acute sinusitis or chronic sinusitis:  Look in your nose for signs of abnormal growths in your nostrils (nasal polyps).  Tap over the affected sinus to check for signs of infection.  View the inside of your sinuses using an imaging device that has a light attached (endoscope). If your health care provider suspects that you have chronic sinusitis, one or more of the following tests may be recommended:  Allergy tests.  Nasal culture. A sample of mucus is taken from your nose, sent to a lab, and screened for bacteria.  Nasal cytology. A sample of mucus is taken from your nose and examined by your health care provider to determine if your sinusitis is related to an allergy. TREATMENT Most cases of acute sinusitis are related to a viral infection and will resolve on their own within 10 days. Sometimes, medicines are prescribed to help relieve symptoms of both acute and chronic sinusitis. These may include pain medicines, decongestants, nasal steroid sprays, or saline sprays. However, for sinusitis related to a bacterial infection, your health care provider will prescribe antibiotic medicines.   These are medicines that will help kill the bacteria causing the infection. Rarely, sinusitis is caused by a fungal infection. In these cases, your health care provider will prescribe antifungal medicine. For some cases of chronic sinusitis, surgery is needed. Generally, these are cases in which sinusitis recurs more than 3 times per year, despite other treatments. HOME CARE INSTRUCTIONS  Drink plenty of water. Water helps thin the mucus so your sinuses can drain more easily.  Use a humidifier.  Inhale steam 3-4 times a day (for example, sit in  the bathroom with the shower running).  Apply a warm, moist washcloth to your face 3-4 times a day, or as directed by your health care provider.  Use saline nasal sprays to help moisten and clean your sinuses.  Take medicines only as directed by your health care provider.  If you were prescribed either an antibiotic or antifungal medicine, finish it all even if you start to feel better. SEEK IMMEDIATE MEDICAL CARE IF:  You have increasing pain or severe headaches.  You have nausea, vomiting, or drowsiness.  You have swelling around your face.  You have vision problems.  You have a stiff neck.  You have difficulty breathing.   This information is not intended to replace advice given to you by your health care provider. Make sure you discuss any questions you have with your health care provider.   Document Released: 03/24/2005 Document Revised: 04/14/2014 Document Reviewed: 04/08/2011 Elsevier Interactive Patient Education 2016 Elsevier Inc.  

## 2015-04-27 NOTE — ED Notes (Signed)
Pt begins to detail multiple c/o but says she needs a CT of her head because she believes something could be wrong. Pt is a/o vitals stable. NAD.

## 2015-04-27 NOTE — ED Provider Notes (Signed)
CSN: [id number]     Arrival date & time 04/27/15  4098 History   First MD Initiated Contact with Patient 04/27/15 [phone]     Chief Complaint  Patient presents with  . Dizziness     (Consider location/radiation/quality/duration/timing/severity/associated sxs/prior Treatment) HPI Patient presents with ongoing vertigo. Recently diagnosed roughly 10 days ago with sinus infection and vertigo. Started on meclizine and antibiotic. She states she's been taking antibiotic with improved sinus symptoms. States she had spinning sensation all driving. Currently denies any vertiginous symptoms. She's had ongoing ear discomfort for which she's been using eardrops. She denies any fever or chills. No focal weakness or numbness. Past Medical History  Diagnosis Date  . Arthritis    Past Surgical History  Procedure Laterality Date  . Abdominal hysterectomy     No family history on file. Social History  Substance Use Topics  . Smoking status: Never Smoker   . Smokeless tobacco: Never Used  . Alcohol Use: No   OB History    No data available     Review of Systems  Constitutional: Negative for fever and chills.  HENT: Positive for congestion, ear pain and sinus pressure.   Eyes: Negative for visual disturbance.  Respiratory: Negative for cough and shortness of breath.   Cardiovascular: Negative for chest pain.  Gastrointestinal: Negative for nausea, vomiting, abdominal pain and diarrhea.  Musculoskeletal: Negative for myalgias, back pain, joint swelling, gait problem, neck pain and neck stiffness.  Skin: Negative for rash and wound.  Neurological: Positive for dizziness. Negative for syncope, weakness, light-headedness, numbness and headaches.  All other systems reviewed and are negative.     Allergies  Fluconazole in dextrose  Home Medications   Prior to Admission medications   Medication Sig Start Date End Date Taking? Authorizing Provider  amoxicillin (AMOXIL) 875 MG tablet Take 875 mg  by mouth 2 (two) times daily.   Yes Historical Provider, MD  meclizine (ANTIVERT) 12.5 MG tablet Take 12.5 mg by mouth 3 (three) times daily as needed for dizziness or nausea.   Yes Historical Provider, MD  loratadine (CLARITIN) 10 MG tablet Take 1 tablet (10 mg total) by mouth daily. 04/27/15   Loren Racer, MD  mometasone (NASONEX) 50 MCG/ACT nasal spray Place 2 sprays into the nose daily. 04/27/15   Loren Racer, MD  oxymetazoline (AFRIN) 0.05 % nasal spray Place 2 sprays into both nostrils 2 (two) times daily. Used no more than 3 days. 04/27/15   Loren Racer, MD   BP 115/76 mmHg  Pulse 75  Temp(Src) 98.1 F (36.7 C) (Oral)  Resp 18  Ht 5' (1.524 m)  Wt 233 lb (105.688 kg)  BMI 45.50 kg/m2  SpO2 100% Physical Exam  Constitutional: She is oriented to person, place, and time. She appears well-developed and well-nourished. No distress.  HENT:  Head: Normocephalic and atraumatic.  Mouth/Throat: Oropharynx is clear and moist. No oropharyngeal exudate.  Patient with maxillary tenderness to percussion. She has bilateral bulging TMs. No erythema. Oropharynx is clear. Patient bilateral nasal mucosal edema.  Eyes: EOM are normal. Pupils are equal, round, and reactive to light.  Several beats of nystagmus that is fatigable.  Neck: Normal range of motion. Neck supple. No JVD present.  Cardiovascular: Normal rate and regular rhythm.  Exam reveals no gallop and no friction rub.   No murmur heard. Pulmonary/Chest: Effort normal and breath sounds normal. No stridor. No respiratory distress. She has no wheezes. She has no rales.  Abdominal: Soft. Bowel sounds are normal.  She exhibits no distension and no mass. There is no tenderness. There is no rebound and no guarding.  Musculoskeletal: Normal range of motion. She exhibits no edema or tenderness.  Lymphadenopathy:    She has no cervical adenopathy.  Neurological: She is alert and oriented to person, place, and time.  Patient is alert and  oriented x3 with clear, goal oriented speech. Patient has 5/5 motor in all extremities. Sensation is intact to light touch. Bilateral finger-to-nose is normal with no signs of dysmetria. Patient has a normal gait and walks without assistance.  Skin: Skin is warm and dry. No rash noted. No erythema.  Psychiatric: She has a normal mood and affect. Her behavior is normal.  Nursing note and vitals reviewed.   ED Course  Procedures (including critical care time) Labs Review Labs Reviewed - No data to display  Imaging Review No results found. I have personally reviewed and evaluated these images and lab results as part of my medical decision-making.   EKG Interpretation None      MDM   Final diagnoses:  Peripheral vertigo involving right ear  Nasal congestion    Patient with evidence of peripheral vertigo. Likely due to a URI. We'll treat symptomatically.    Loren Racer, MD 04/29/15 2227

## 2015-04-27 NOTE — ED Notes (Addendum)
45 yo c/o dizziness r/t vertigo. Says she was went to PCP was dx sinusitis and vertigo and was given antibiotic and Antivert. 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   Addendum: Pt has multiple c/o states she does not see her PCP like she should. Pt is ambulatory with steady gait. NAD

## 2015-10-30 DIAGNOSIS — J302 Other seasonal allergic rhinitis: Secondary | ICD-10-CM | POA: Insufficient documentation

## 2016-06-16 ENCOUNTER — Emergency Department (HOSPITAL_BASED_OUTPATIENT_CLINIC_OR_DEPARTMENT_OTHER)
Admission: EM | Admit: 2016-06-16 | Discharge: 2016-06-16 | Disposition: A | Discharge status: Home / Self Care | Payer: Managed Care, Other (non HMO) | Attending: Emergency Medicine | Admitting: Emergency Medicine

## 2016-06-16 ENCOUNTER — Encounter (HOSPITAL_BASED_OUTPATIENT_CLINIC_OR_DEPARTMENT_OTHER): Payer: Self-pay | Admitting: *Deleted

## 2016-06-16 DIAGNOSIS — K0889 Other specified disorders of teeth and supporting structures: Secondary | ICD-10-CM | POA: Diagnosis not present

## 2016-06-16 DIAGNOSIS — H9202 Otalgia, left ear: Secondary | ICD-10-CM

## 2016-06-16 MED ORDER — IBUPROFEN 400 MG PO TABS: 400.0000 mg | ORAL_TABLET | Freq: Four times a day (QID) | ORAL | 0 refills | Status: DC | PRN

## 2016-06-16 MED ORDER — PENICILLIN V POTASSIUM 500 MG PO TABS: 500.0000 mg | ORAL_TABLET | Freq: Four times a day (QID) | ORAL | 0 refills | Status: AC

## 2016-06-16 MED FILL — IBUPROFEN 400 MG TABLET: 400 | 3 days supply | Qty: 15 | Fill #0

## 2016-06-16 MED FILL — PENICILLIN VK 500 MG TABLET: 500 | 7 days supply | Qty: 28 | Fill #0

## 2016-06-16 MED FILL — BUTALB-ACETAMIN-CAFF 50-325: 50-325-40 | 7 days supply | Qty: 30 | Fill #0

## 2016-06-16 NOTE — Discharge Instructions (Signed)
Follow-up with your Dentist tomorrow as planned.

## 2016-06-16 NOTE — ED Provider Notes (Signed)
[facility]   CSN: [id number] Arrival date & time: 06/16/16  1422     History   Chief Complaint Chief Complaint  Patient presents with  . Dental Pain    HPI Marissa Lin is a 46 y.o. female.  HPI Patient has pain in her right upper jaw and left ear. Both began a few days ago. States she has had a chronic broken off tooth on the right side that is just recently become painful. States she has appointment with the dentist tomorrow. No fevers. Now has crampy pain in her left ear. No changes in hearing. No vertigo although she has a history of this. Not worse with moving the jaw. Pain is at the ear area and a little below.   Past Medical History:  Diagnosis Date  . Arthritis     Patient Active Problem List   Diagnosis Date Noted  . Plantar fasciitis of left foot 06/13/2014  . Onychomycosis due to dermatophyte 06/13/2014  . Tinea pedis of both feet 06/13/2014  . 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 ABDOMINAL HYSTERECTOMY      OB History    No data available       Home Medications    Prior to Admission medications   Medication Sig Start Date End Date Taking? Authorizing Provider  loratadine (CLARITIN) 10 MG tablet Take 1 tablet (10 mg total) by mouth daily. 04/27/15  Yes Loren Racer, MD  mometasone (NASONEX) 50 MCG/ACT nasal spray Place 2 sprays into the nose daily. 04/27/15  Yes Loren Racer, MD  oxymetazoline (AFRIN) 0.05 % nasal spray Place 2 sprays into both nostrils 2 (two) times daily. Used no more than 3 days. 04/27/15  Yes Loren Racer, MD  amoxicillin (AMOXIL) 875 MG tablet Take 875 mg by mouth 2 (two) times daily.    Historical Provider, MD  ibuprofen (ADVIL,MOTRIN) 400 MG tablet Take 1 tablet (400 mg total) by mouth every 6 (six) hours as needed. 06/16/16   Benjiman Core, MD  meclizine (ANTIVERT) 12.5 MG tablet Take 12.5 mg by mouth 3 (three) times daily as  needed for dizziness or nausea.    Historical Provider, MD  penicillin v potassium (VEETID) 500 MG tablet Take 1 tablet (500 mg total) by mouth 4 (four) times daily. 06/16/16 06/23/16  Benjiman Core, MD    Family History No family history on file.  Social History Social History  Substance Use Topics  . Smoking status: Never Smoker  . Smokeless tobacco: Never Used  . Alcohol use No     Allergies   Fluconazole in dextrose   Review of Systems Review of Systems  Constitutional: Negative for appetite change and fever.  HENT: Positive for dental problem and ear pain. Negative for facial swelling, hearing loss, sinus pain, sinus pressure and sore throat.   Respiratory: Negative for shortness of breath.   Cardiovascular: Negative for chest pain.  Gastrointestinal: Negative for abdominal pain.  Musculoskeletal: Negative for back pain.  Skin: Negative for rash.  Neurological: Negative for weakness, light-headedness and numbness.  Psychiatric/Behavioral: Negative for confusion.     Physical Exam Updated Vital Signs BP 139/99 (BP Location: Right Arm)   Pulse 83   Temp 98.2 F (36.8 C) (Oral)   Resp 20   Ht 5' (1.524 m)   Wt 230 lb (104.3 kg)   SpO2 100%   BMI 44.92 kg/m   Physical Exam  Constitutional: She appears well-developed.  HENT:  Head: Normocephalic.  Right Ear: External ear normal.  Left Ear: External ear normal.  Mild tenderness below the left ear. No TMJ tenderness. Good range of motion of the jaw. Bilateral TMs normal. No effusion. Slight posterior pharyngeal erythema. Right upper first premolar is broken off at the gumline with some surrounding erythema.  Eyes: EOM are normal.  Neck: Neck supple.  Cardiovascular: Normal rate.   Pulmonary/Chest: Effort normal.  Abdominal: Soft. There is no tenderness.  Musculoskeletal: She exhibits no edema.     ED Treatments / Results  Labs (all labs ordered are listed, but only abnormal results are displayed) Labs  Reviewed - No data to display  EKG  EKG Interpretation None       Radiology No results found.  Procedures Procedures (including critical care time)  Medications Ordered in ED Medications - No data to display   Initial Impression / Assessment and Plan / ED Course  I have reviewed the triage vital signs and the nursing notes.  Pertinent labs & imaging results that were available during my care of the patient were reviewed by me and considered in my medical decision making (see chart for details).     Patient with dental pain from broken off tooth. No clear drainable abscess. Bilateral TMs normal. Does have ear pain but overall benign exam. Could be musculoskeletal. Will give antibiotics to cover the teeth. Also Motrin. Discharge home. Has dental follow-up tomorrow.  Final Clinical Impressions(s) / ED Diagnoses   Final diagnoses:  Pain, dental  Left ear pain    New Prescriptions Discharge Medication List as of 06/16/2016  2:47 PM    START taking these medications   Details  ibuprofen (ADVIL,MOTRIN) 400 MG tablet Take 1 tablet (400 mg total) by mouth every 6 (six) hours as needed., Starting Mon 06/16/2016, Print    penicillin v potassium (VEETID) 500 MG tablet Take 1 tablet (500 mg total) by mouth 4 (four) times daily., Starting Mon 06/16/2016, Until Mon 06/23/2016, Print         Benjiman CoreNathan Hodges Treiber, MD 06/16/16 1452

## 2016-06-16 NOTE — ED Triage Notes (Signed)
Pain in her left ear. Separate problem is toothache.

## 2016-06-29 ENCOUNTER — Encounter (HOSPITAL_BASED_OUTPATIENT_CLINIC_OR_DEPARTMENT_OTHER): Payer: Self-pay | Admitting: Emergency Medicine

## 2016-06-29 ENCOUNTER — Emergency Department (HOSPITAL_BASED_OUTPATIENT_CLINIC_OR_DEPARTMENT_OTHER): Payer: Managed Care, Other (non HMO)

## 2016-06-29 ENCOUNTER — Emergency Department (HOSPITAL_BASED_OUTPATIENT_CLINIC_OR_DEPARTMENT_OTHER)
Admission: EM | Admit: 2016-06-29 | Discharge: 2016-06-30 | Disposition: A | Discharge status: Home / Self Care | Payer: Managed Care, Other (non HMO) | Attending: Emergency Medicine | Admitting: Emergency Medicine

## 2016-06-29 DIAGNOSIS — Y999 Unspecified external cause status: Secondary | ICD-10-CM | POA: Diagnosis not present

## 2016-06-29 DIAGNOSIS — T148XXA Other injury of unspecified body region, initial encounter: Secondary | ICD-10-CM

## 2016-06-29 DIAGNOSIS — R52 Pain, unspecified: Secondary | ICD-10-CM

## 2016-06-29 DIAGNOSIS — S39011A Strain of muscle, fascia and tendon of abdomen, initial encounter: Secondary | ICD-10-CM | POA: Diagnosis not present

## 2016-06-29 DIAGNOSIS — X58XXXA Exposure to other specified factors, initial encounter: Secondary | ICD-10-CM | POA: Insufficient documentation

## 2016-06-29 DIAGNOSIS — Y939 Activity, unspecified: Secondary | ICD-10-CM | POA: Insufficient documentation

## 2016-06-29 DIAGNOSIS — Y929 Unspecified place or not applicable: Secondary | ICD-10-CM | POA: Diagnosis not present

## 2016-06-29 DIAGNOSIS — R1031 Right lower quadrant pain: Secondary | ICD-10-CM | POA: Diagnosis present

## 2016-06-29 LAB — URINALYSIS, ROUTINE W REFLEX MICROSCOPIC
BILIRUBIN URINE: NEGATIVE
GLUCOSE, UA: NEGATIVE mg/dL
HGB URINE DIPSTICK: NEGATIVE
Ketones, ur: NEGATIVE mg/dL
Leukocytes, UA: NEGATIVE
Nitrite: NEGATIVE
Protein, ur: NEGATIVE mg/dL
SPECIFIC GRAVITY, URINE: 1.022 (ref 1.005–1.030)
pH: 7.5 (ref 5.0–8.0)

## 2016-06-29 LAB — PREGNANCY, URINE: PREG TEST UR: NEGATIVE

## 2016-06-29 NOTE — ED Provider Notes (Signed)
[facility]   CSN: [id number] Arrival date & time: 06/29/16  2158   By signing my name below, I, Teofilo Pod, attest that this documentation has been prepared under the direction and in the presence of Canio Winokur, MD . Electronically Signed: Teofilo Pod, ED Scribe. 06/29/2016. 11:11 PM.   History   Chief Complaint Chief Complaint  Patient presents with  . Flank Pain    The history is provided by the patient. No language interpreter was used.  Flank Pain  This is a new problem. The current episode started 12 to 24 hours ago. The problem occurs constantly. The problem has not changed since onset.Associated symptoms include abdominal pain. Nothing aggravates the symptoms. Nothing relieves the symptoms. She has tried nothing for the symptoms. The treatment provided no relief.  HPI Comments:  Lumen Brinlee is a 46 y.o. female who presents to the Emergency Department complaining of constant right lower abdominal pain that began this AM.  She states that the pain radiates to her back. She states that she had difficulty getting out of bed this morning. Pt had a tooth extracted last week and was given penicillin and ibuprofen, but she did not take the ibuprofen today. Pt was diagnosed with an ovarian cyst last year, and reports hx of hysterectomy. She states that she no longer has periods. No alleviating factors noted. Denies nausea, vomiting, diarrhea, urinary symptoms, constipation, diarrhea.    Past Medical History:  Diagnosis Date  . Arthritis     Patient Active Problem List   Diagnosis Date Noted  . Plantar fasciitis of left foot 06/13/2014  . Onychomycosis due to dermatophyte 06/13/2014  . Tinea pedis of both feet 06/13/2014  . 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 ABDOMINAL HYSTERECTOMY      OB History    No data available       Home Medications    Prior to  Admission medications   Medication Sig Start Date End Date Taking? Authorizing Provider  amoxicillin (AMOXIL) 875 MG tablet Take 875 mg by mouth 2 (two) times daily.    Historical Provider, MD  ibuprofen (ADVIL,MOTRIN) 400 MG tablet Take 1 tablet (400 mg total) by mouth every 6 (six) hours as needed. 06/16/16   Benjiman Core, MD  loratadine (CLARITIN) 10 MG tablet Take 1 tablet (10 mg total) by mouth daily. 04/27/15   Loren Racer, MD  meclizine (ANTIVERT) 12.5 MG tablet Take 12.5 mg by mouth 3 (three) times daily as needed for dizziness or nausea.    Historical Provider, MD  mometasone (NASONEX) 50 MCG/ACT nasal spray Place 2 sprays into the nose daily. 04/27/15   Loren Racer, MD  oxymetazoline (AFRIN) 0.05 % nasal spray Place 2 sprays into both nostrils 2 (two) times daily. Used no more than 3 days. 04/27/15   Loren Racer, MD    Family History History reviewed. No pertinent family history.  Social History Social History  Substance Use Topics  . Smoking status: Never Smoker  . Smokeless tobacco: Never Used  . Alcohol use No     Allergies   Fluconazole in dextrose   Review of Systems Review of Systems  Constitutional: Negative for fever.  Gastrointestinal: Positive for abdominal pain. Negative for blood in stool, diarrhea, nausea and vomiting.  Genitourinary: Positive for flank pain. Negative for dysuria, hematuria and vaginal bleeding.  All other systems reviewed and are negative.    Physical Exam Updated Vital  Signs BP 125/74 (BP Location: Left Arm)   Pulse 94   Temp 98.6 F (37 C) (Oral)   Resp 20   Ht 5' (1.524 m)   Wt 230 lb (104.3 kg)   SpO2 100%   BMI 44.92 kg/m   Physical Exam  Constitutional: She is oriented to person, place, and time. She appears well-developed and well-nourished. No distress.  HENT:  Head: Normocephalic and atraumatic.  Mouth/Throat: Oropharynx is clear and moist. No oropharyngeal exudate.  Trachea midline  Eyes: Conjunctivae  and EOM are normal. Pupils are equal, round, and reactive to light.  Neck: Trachea normal and normal range of motion. Neck supple. No JVD present. Carotid bruit is not present.  Cardiovascular: Normal rate, regular rhythm and intact distal pulses.  Exam reveals no gallop and no friction rub.   No murmur heard. Pulmonary/Chest: Effort normal and breath sounds normal. No stridor. She has no wheezes. She has no rales.  Lungs clear.  Abdominal: Soft. She exhibits no mass. There is no tenderness. There is no rebound and no guarding.  Hyperactive bowel sounds.  Musculoskeletal: Normal range of motion.  Lymphadenopathy:    She has no cervical adenopathy.  Neurological: She is alert and oriented to person, place, and time. She has normal reflexes. She displays normal reflexes. No cranial nerve deficit. She exhibits normal muscle tone. Coordination normal.  Cranial nerves 2-12 intact  Skin: Skin is warm and dry. Capillary refill takes less than 2 seconds. She is not diaphoretic.  Psychiatric: She has a normal mood and affect. Her behavior is normal.     ED Treatments / Results   Vitals:   06/29/16 2330 06/30/16 0023  BP: 127/85 108/69  Pulse: 78 89  Resp:  16  Temp:       DIAGNOSTIC STUDIES:  Oxygen Saturation is 100% on RA, normal by my interpretation.    COORDINATION OF CARE:  11:11 PM Discussed treatment plan with pt at bedside and pt agreed to plan.  Results for orders placed or performed during the hospital encounter of 06/29/16  Urinalysis, Routine w reflex microscopic- may I&O cath if menses  Result Value Ref Range   Color, Urine YELLOW YELLOW   APPearance CLOUDY (A) CLEAR   Specific Gravity, Urine 1.022 1.005 - 1.030   pH 7.5 5.0 - 8.0   Glucose, UA NEGATIVE NEGATIVE mg/dL   Hgb urine dipstick NEGATIVE NEGATIVE   Bilirubin Urine NEGATIVE NEGATIVE   Ketones, ur NEGATIVE NEGATIVE mg/dL   Protein, ur NEGATIVE NEGATIVE mg/dL   Nitrite NEGATIVE NEGATIVE   Leukocytes, UA  NEGATIVE NEGATIVE  Pregnancy, urine  Result Value Ref Range   Preg Test, Ur NEGATIVE NEGATIVE   Ct Renal Stone Study  Result Date: 06/30/2016 CLINICAL DATA:  Right-sided abdominal pain. EXAM: CT ABDOMEN AND PELVIS WITHOUT CONTRAST TECHNIQUE: Multidetector CT imaging of the abdomen and pelvis was performed following the standard protocol without IV contrast. COMPARISON:  CT 05/13/2016 FINDINGS: Lower chest: The lung bases are clear. Hepatobiliary: No focal liver abnormality is seen. No gallstones, gallbladder wall thickening, or biliary dilatation. Pancreas: No ductal dilatation or inflammation. Spleen: Normal in size without focal abnormality.  Small splenule. Adrenals/Urinary Tract: Normal adrenal glands. No hydronephrosis or perinephric edema. No urolithiasis. Urinary bladder is minimally distended. Stomach/Bowel: Stomach distended with ingested contents. No evidence of bowel wall thickening, distention or inflammation. Moderate stool burden throughout the colon. Normal appendix. Vascular/Lymphatic: Normal caliber abdominal aorta. Circumaortic left renal vein. Phleboliths in the left gonadal vein, stable. No  adenopathy. Reproductive: Status post hysterectomy. No adnexal masses. Other: No free air, free fluid, or intra-abdominal fluid collection. No significant abdominal wall hernia. Musculoskeletal: Stable degenerative change in the lumbar spine. There are no acute or suspicious osseous abnormalities. IMPRESSION: No acute abnormality or explanation for right-sided abdominal pain. Electronically Signed   By: Rubye OaksMelanie  Ehinger M.D.   On: 06/30/2016 00:10    Radiology No results found.  Procedures Procedures (including critical care time)  Medications Ordered in ED Medications  ketorolac (TORADOL) injection 60 mg (60 mg Intramuscular Given 06/30/16 0043)      Final Clinical Impressions(s) / ED Diagnoses  Abdominal wall pain:  The patient is nontoxic-appearing on exam and vital signs are within  normal limits. Return for vomiting, inability to pass urine or stool, fevers intractable pain or any concerns.  After history, exam, and medical workup I feel the patient has been appropriately medically screened and is safe for discharge home. Pertinent diagnoses were discussed with the patient. Patient was given return precautions.  I personally performed the services described in this documentation, which was scribed in my presence. The recorded information has been reviewed and is accurate.       Cy BlamerApril Askari Kinley, MD 06/30/16 0430

## 2016-06-29 NOTE — ED Triage Notes (Signed)
Patient reports right lower abdominal pain which radiates into her back.  Denies N/V/D, dysuria but states she feels a "pressure when she urinates".  Patient does report cyst on her right ovary but states the pain she has experienced today has been different.

## 2016-06-29 NOTE — ED Notes (Addendum)
EDP Dr. Nicanor AlconPalumbo into room, prior to RN assessment, see MD notes, pending orders.

## 2016-06-29 NOTE — ED Notes (Signed)
Resting comfortably, no changes, given warm blanket, to CT via stretcher.

## 2016-06-29 NOTE — ED Notes (Addendum)
Alert, NAD, calm, interactive, resps e/u, speaking in clear complete sentences, no dyspnea noted, skin W&D, VSS, c/o RLQ pain, wrapping around to R lower back/flank, onset today, (denies: sob, NVD, constipation, dizziness, urinary or vaginal sx, bleeding, or other sx).

## 2016-06-30 ENCOUNTER — Encounter (HOSPITAL_BASED_OUTPATIENT_CLINIC_OR_DEPARTMENT_OTHER): Payer: Self-pay | Admitting: Emergency Medicine

## 2016-06-30 MED ORDER — NAPROXEN 375 MG PO TABS: 375.0000 mg | ORAL_TABLET | Freq: Two times a day (BID) | ORAL | 0 refills | Status: DC

## 2016-06-30 MED ORDER — KETOROLAC TROMETHAMINE 60 MG/2ML IM SOLN
60.0000 mg | Freq: Once | INTRAMUSCULAR | Status: AC
Start: 2016-06-30 — End: 2016-06-30
  Administered 2016-06-30: 60 mg via INTRAMUSCULAR
  Filled 2016-06-30: qty 2

## 2016-08-18 ENCOUNTER — Other Ambulatory Visit: Payer: Self-pay

## 2016-08-18 ENCOUNTER — Emergency Department (HOSPITAL_BASED_OUTPATIENT_CLINIC_OR_DEPARTMENT_OTHER)
Admission: EM | Admit: 2016-08-18 | Discharge: 2016-08-19 | Disposition: A | Discharge status: Home / Self Care | Payer: Managed Care, Other (non HMO) | Attending: Emergency Medicine | Admitting: Emergency Medicine

## 2016-08-18 ENCOUNTER — Encounter (HOSPITAL_BASED_OUTPATIENT_CLINIC_OR_DEPARTMENT_OTHER): Payer: Self-pay

## 2016-08-18 DIAGNOSIS — G5601 Carpal tunnel syndrome, right upper limb: Secondary | ICD-10-CM | POA: Insufficient documentation

## 2016-08-18 DIAGNOSIS — M25511 Pain in right shoulder: Secondary | ICD-10-CM

## 2016-08-18 DIAGNOSIS — Z79899 Other long term (current) drug therapy: Secondary | ICD-10-CM | POA: Insufficient documentation

## 2016-08-18 DIAGNOSIS — M79601 Pain in right arm: Secondary | ICD-10-CM | POA: Diagnosis present

## 2016-08-18 HISTORY — DX: Carpal tunnel syndrome, unspecified upper limb: G56.00

## 2016-08-18 HISTORY — DX: Migraine, unspecified, not intractable, without status migrainosus: G43.909

## 2016-08-18 NOTE — ED Triage Notes (Signed)
c/ pain to right arm, shoulder x 2 days-c/o fatigue, nausea, lower back pain x today-NAD-steady gait

## 2016-08-19 MED ORDER — NAPROXEN 375 MG PO TABS: 375.0000 mg | ORAL_TABLET | Freq: Two times a day (BID) | ORAL | 0 refills | Status: DC | PRN

## 2016-08-19 MED ORDER — NAPROXEN 250 MG PO TABS
500.0000 mg | ORAL_TABLET | Freq: Once | ORAL | Status: AC
  Administered 2016-08-19: 500 mg via ORAL
  Filled 2016-08-19: qty 2

## 2016-08-19 NOTE — ED Notes (Signed)
Alert, NAD, calm, interactive, resps e/u, speaking in clear complete sentences, no dyspnea noted, skin W&D, VSS. Family at BS. 

## 2016-08-19 NOTE — ED Notes (Addendum)
EDP into room, prior to this RN assessment, see MD notes, pending orders.

## 2016-08-19 NOTE — ED Provider Notes (Signed)
[facility] Provider Note: Lowella DellJ. Lane Damein Gaunce, MD, FACEP  CSN: [id number] MRN: [id number] ARRIVAL: 08/18/16 at 2248 ROOM: [facility]01/[facility]01   CHIEF COMPLAINT  Arm Pain   HISTORY OF PRESENT ILLNESS  Marissa Lin is a 46 y.o. female who was diagnosed with right carpal tunnel syndrome several weeks ago. She has been seen for this and has a nerve conduction study pending. She is here with about a one-week history of pain in her right shoulder. She states the pain is a sharp stabbing pain that seems to radiate from her right wrist but is also exacerbated by movement of the right shoulder or palpation of the right shoulder anteriorly. She rates the pain is severe at its worst. It has gotten worse over the past 2 days. She did have nausea earlier along with some anxiety. She denies chest pain, vomiting or diarrhea. She was placed on gabapentin but has not been able to take it because it makes her too drowsy to work.   Past Medical History:  Diagnosis Date  . Arthritis   . Carpal tunnel syndrome   . Migraine     Past Surgical History:  Procedure Laterality Date  . ABDOMINAL HYSTERECTOMY    . CESAREAN SECTION      No family history on file.  Social History  Substance Use Topics  . Smoking status: Never Smoker  . Smokeless tobacco: Never Used  . Alcohol use No    Prior to Admission medications   Medication Sig Start Date End Date Taking? Authorizing Provider  PHENTERMINE HCL PO Take by mouth.   Yes [provider]  meclizine (ANTIVERT) 12.5 MG tablet Take 12.5 mg by mouth 3 (three) times daily as needed for dizziness or nausea.    [provider]    Allergies Diflucan [fluconazole]   REVIEW OF SYSTEMS  Negative except as noted here or in the History of Present Illness.   PHYSICAL EXAMINATION  Initial Vital Signs Blood pressure 133/88, pulse 83, temperature 98 F (36.7 C), temperature source Oral, resp. rate 18, height 5' (1.524 m), weight  246 lb (111.6 kg), SpO2 98 %.  Examination General: Well-developed, well-nourished female in no acute distress; appearance consistent with age of record HENT: normocephalic; atraumatic Eyes: pupils equal, round and reactive to light; extraocular muscles intact Neck: supple Heart: regular rate and rhythm Lungs: clear to auscultation bilaterally Abdomen: soft; nondistended; nontender; bowel sounds present Extremities: No deformity; full range of motion except right shoulder; pulses normal; positive Phalen's test on the right; equivocal Tinel's test on the right; tenderness of anterior right shoulder with pain on attempted passive range of motion Neurologic: Awake, alert and oriented; motor function intact in all extremities and symmetric; no facial droop Skin: Warm and dry Psychiatric: Normal mood and affect   RESULTS  Summary of this visit's results, reviewed by myself:   EKG Interpretation  Date/Time:  Monday Aug 18 2016 23:00:50 EDT Ventricular Rate:  83 PR Interval:  152 QRS Duration: 88 QT Interval:  380 QTC Calculation: 446 R Axis:   3 Text Interpretation:  Normal sinus rhythm Nonspecific T wave abnormality Abnormal ECG No significant change was found Confirmed by Gradie Ohm  MD, Jonny RuizJOHN ([id number]) on 08/18/2016 11:10:45 PM      Laboratory Studies: No results found for this or any previous visit (from the past 24 hour(s)). Imaging Studies: No results found.  ED COURSE  Nursing notes and initial vitals signs, including pulse oximetry, reviewed.  Vitals:   08/18/16 2300 08/18/16 2302  BP: 133/88   Pulse: 83   Resp: 18   Temp: 98 F (36.7 C)   TempSrc: Oral   SpO2: 98%   Weight:  246 lb (111.6 kg)  Height:  5' (1.524 m)    PROCEDURES    ED DIAGNOSES     ICD-9-CM ICD-10-CM   1. Carpal tunnel syndrome of right wrist 354.0 G56.01   2. Acute pain of right shoulder 719.41 M25.511        Lavonne Cass, Jonny Ruiz, MD 08/19/16 [phone]

## 2016-09-26 ENCOUNTER — Encounter (HOSPITAL_BASED_OUTPATIENT_CLINIC_OR_DEPARTMENT_OTHER): Payer: Self-pay | Admitting: Emergency Medicine

## 2016-09-26 ENCOUNTER — Emergency Department (HOSPITAL_BASED_OUTPATIENT_CLINIC_OR_DEPARTMENT_OTHER)
Admission: EM | Admit: 2016-09-26 | Discharge: 2016-09-26 | Disposition: A | Discharge status: Home / Self Care | Payer: Managed Care, Other (non HMO) | Attending: Emergency Medicine | Admitting: Emergency Medicine

## 2016-09-26 DIAGNOSIS — Y999 Unspecified external cause status: Secondary | ICD-10-CM | POA: Diagnosis not present

## 2016-09-26 DIAGNOSIS — Z79899 Other long term (current) drug therapy: Secondary | ICD-10-CM | POA: Insufficient documentation

## 2016-09-26 DIAGNOSIS — L03116 Cellulitis of left lower limb: Secondary | ICD-10-CM | POA: Diagnosis not present

## 2016-09-26 DIAGNOSIS — Y929 Unspecified place or not applicable: Secondary | ICD-10-CM | POA: Diagnosis not present

## 2016-09-26 DIAGNOSIS — X58XXXA Exposure to other specified factors, initial encounter: Secondary | ICD-10-CM | POA: Insufficient documentation

## 2016-09-26 DIAGNOSIS — S90822A Blister (nonthermal), left foot, initial encounter: Secondary | ICD-10-CM | POA: Diagnosis not present

## 2016-09-26 DIAGNOSIS — Y939 Activity, unspecified: Secondary | ICD-10-CM | POA: Insufficient documentation

## 2016-09-26 HISTORY — DX: Unspecified acquired deformity of unspecified lower leg: M21.969

## 2016-09-26 HISTORY — DX: Plantar fascial fibromatosis: M72.2

## 2016-09-26 MED ORDER — CEPHALEXIN 500 MG PO CAPS: 500.0000 mg | ORAL_CAPSULE | Freq: Three times a day (TID) | ORAL | 0 refills | Status: DC

## 2016-09-26 MED ORDER — CEPHALEXIN 250 MG PO CAPS
500.0000 mg | ORAL_CAPSULE | Freq: Once | ORAL | Status: AC
  Administered 2016-09-26: 500 mg via ORAL
  Filled 2016-09-26: qty 2

## 2016-09-26 NOTE — Discharge Instructions (Signed)
Soak your foot in warm water several times a day. Return if you are having any problems.

## 2016-09-26 NOTE — ED Notes (Signed)
Bacitracin applied to wound on top of left foot. Dry gauze dressing applied and secured with tape. Pt given instructions on wound care and verbalized understanding.

## 2016-09-26 NOTE — ED Provider Notes (Signed)
[facility] Provider [facility]   CSN: [id number] Arrival date & time: 09/26/16  0152     History   Chief Complaint Chief Complaint  Patient presents with  . Foot Pain    HPI Marissa Lin is a 46 y.o. female.  The history is provided by the patient.  Foot Pain   She was wearing shoes that were tightfitting and developed a blister of. This occurred 5 days ago. Tonight, the blister popped. She noted some redness and swelling around the blister. There is some mild soreness surrounding the blister, but no pain at the actual site of the blister.  Past Medical History:  Diagnosis Date  . Arthritis   . Carpal tunnel syndrome   . Metatarsal deformity   . Migraine   . Plantar fasciitis     Patient Active Problem List   Diagnosis Date Noted  . Plantar fasciitis of left foot 06/13/2014  . Onychomycosis due to dermatophyte 06/13/2014  . Tinea pedis of both feet 06/13/2014  . 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 ABDOMINAL HYSTERECTOMY    . CESAREAN SECTION      OB History    No data available       Home Medications    Prior to Admission medications   Medication Sig Start Date End Date Taking? Authorizing Provider  cephALEXin (KEFLEX) 500 MG capsule Take 1 capsule (500 mg total) by mouth 3 (three) times daily. 09/26/16   Dione Booze, MD  meclizine (ANTIVERT) 12.5 MG tablet Take 12.5 mg by mouth 3 (three) times daily as needed for dizziness or nausea.    [provider]  naproxen (NAPROSYN) 375 MG tablet Take 1 tablet (375 mg total) by mouth 2 (two) times daily as needed (for shoulder or wrist pain). 08/19/16   Molpus, John, MD  PHENTERMINE HCL PO Take by mouth.    [provider]    Family History No family history on file.  Social History Social History  Substance Use Topics  . Smoking status: Never Smoker  . Smokeless tobacco: Never Used  . Alcohol use No     Allergies    Diflucan [fluconazole]   Review of Systems Review of Systems  All other systems reviewed and are negative.    Physical Exam Updated Vital Signs BP (!) 114/103 (BP Location: Right Arm)   Pulse 73   Temp 97.6 F (36.4 C) (Oral)   Resp 20   Ht 5' (1.524 m)   Wt 104.3 kg (230 lb)   SpO2 97%   BMI 44.92 kg/m   Physical Exam  Nursing note and vitals reviewed.  46 year old female, resting comfortably and in no acute distress. Vital signs are normal. Oxygen saturation is 97%, which is normal. Head is normocephalic and atraumatic. PERRLA, EOMI. Oropharynx is clear. Neck is nontender and supple without adenopathy or JVD. Back is nontender and there is no CVA tenderness. Lungs are clear without rales, wheezes, or rhonchi. Chest is nontender. Heart has regular rate and rhythm without murmur. Abdomen is soft, flat, nontender without masses or hepatosplenomegaly and peristalsis is normoactive. Extremities have no cyanosis or edema, full range of motion is present. Bullet is present on the dorsum of the left foot of which is deflated. Mild surrounding erythema and warmth and swelling. No lymphangitic streaks seen. Skin is warm and dry without rash. Neurologic: Mental status is normal, cranial nerves are intact, there are no motor or sensory deficits.  ED Treatments / Results   Procedures Debridement Date/Time: 09/26/2016 3:45 AM Performed by: Dione BoozeGLICK, Orion Mole Authorized by: Preston FleetingGLICK, Jaquanna Ballentine  Consent: Verbal consent obtained. Written consent not obtained. Consent given by: patient Patient understanding: patient states understanding of the procedure being performed Patient consent: the patient's understanding of the procedure matches consent given Procedure consent: procedure consent matches procedure scheduled Relevant documents: relevant documents present and verified Site marked: the operative site was marked Required items: required blood products, implants, devices, and special  equipment available Patient identity confirmed: verbally with patient and arm band Time out: Immediately prior to procedure a "time out" was called to verify the correct patient, procedure, equipment, support staff and site/side marked as required. Local anesthesia used: no  Anesthesia: Local anesthesia used: no  Sedation: Patient sedated: no Patient tolerance: Patient tolerated the procedure well with no immediate complications Comments: Devitalized skin debrided from the site of the patella on the dorsum of left foot. Debridement was done by sharp dissection.    (including critical care time)  Medications Ordered in ED Medications  cephALEXin (KEFLEX) capsule 500 mg (not administered)     Initial Impression / Assessment and Plan / ED Course  I have reviewed the triage vital signs and the nursing notes.  Bulla of left foot which has spontaneously ruptured, with surrounding zone of cellulitis. The devitalized skin at the surface was abraded and she is discharged with prescription for cephalexin. Follow-up with PCP. Old records are reviewed, and she has no relevant past visits.  Final Clinical Impressions(s) / ED Diagnoses   Final diagnoses:  Blister of left foot, initial encounter  Cellulitis of left foot    New Prescriptions New Prescriptions   CEPHALEXIN (KEFLEX) 500 MG CAPSULE    Take 1 capsule (500 mg total) by mouth 3 (three) times daily.     Dione BoozeGlick, Kirstin Kugler, MD 09/26/16 [phone]

## 2016-09-26 NOTE — ED Triage Notes (Signed)
Pt here POV with a complaint of foot pain. Pt reports wearing shoes that were tight on the top of her foot while traveling on an airplane. Pt has blister to right foot and mild swelling to right ankle. Pt had steady gate while ambulating to room.

## 2017-02-25 ENCOUNTER — Other Ambulatory Visit: Payer: Self-pay

## 2017-02-25 ENCOUNTER — Emergency Department (HOSPITAL_BASED_OUTPATIENT_CLINIC_OR_DEPARTMENT_OTHER)
Admission: EM | Admit: 2017-02-25 | Discharge: 2017-02-25 | Disposition: A | Discharge status: Home / Self Care | Payer: Managed Care, Other (non HMO) | Attending: Emergency Medicine | Admitting: Emergency Medicine

## 2017-02-25 ENCOUNTER — Encounter (HOSPITAL_BASED_OUTPATIENT_CLINIC_OR_DEPARTMENT_OTHER): Payer: Self-pay

## 2017-02-25 DIAGNOSIS — J069 Acute upper respiratory infection, unspecified: Secondary | ICD-10-CM | POA: Diagnosis not present

## 2017-02-25 DIAGNOSIS — Z79899 Other long term (current) drug therapy: Secondary | ICD-10-CM | POA: Insufficient documentation

## 2017-02-25 MED ORDER — FLUTICASONE PROPIONATE 50 MCG/ACT NA SUSP: 2.0000 | Freq: Every day | NASAL | 0 refills | Status: DC

## 2017-02-25 MED ORDER — ACETAMINOPHEN 325 MG PO TABS
650.0000 mg | ORAL_TABLET | Freq: Once | ORAL | Status: AC
Start: 2017-02-25 — End: 2017-02-25
  Administered 2017-02-25: 650 mg via ORAL
  Filled 2017-02-25: qty 2

## 2017-02-25 MED ORDER — CETIRIZINE HCL 10 MG PO TABS
10.0000 mg | ORAL_TABLET | Freq: Every day | ORAL | 0 refills | Status: DC
Start: 2017-02-25 — End: 2019-08-04

## 2017-02-25 NOTE — ED Notes (Signed)
Pt verbalizes understanding of d/c instructions and denies any further needs at this time. 

## 2017-02-25 NOTE — Discharge Instructions (Signed)
Please read and follow all provided instructions.  Your diagnoses today include:  1. Viral upper respiratory tract infection     Tests performed today include: Vital signs. See below for your results today.   Medications prescribed/advised:  1. Musinex [Guaifenesin] as a decongestant [thin mucus - you have to be well hydrated when taking this for it to work] - you can find this over the counter.  2. Tylenol for fever/pain and Motrin/Ibuprofen for muscle aches 3. Flonase Steroid Nasal Spray. This does not work to maximum capability unless used daily >1-2 weeks.  4. Allegra or Zyrtec: This medication is an allergy medication that may aid in helping to relieve your symptoms. Please take daily and discuss with your PCP if you should remain on this medication at follow up. If you were prescribed a allergy medication (allegra) please take daily.   Home care instructions:  An upper respiratory infection (URI) is also sometimes known as the common cold. Most people improve within 1 week, but symptoms can last up to 2 weeks. A residual cough may last even longer.   URI is most commonly caused by a virus. Viruses are NOT treated with antibiotics. You can easily spread the virus to others by oral contact. This includes kissing, sharing a glass, coughing, or sneezing. Touching your mouth or nose and then touching a surface, which is then touched by another person, can also spread the virus.   TREATMENT  Treatment is directed at relieving symptoms. There is no cure. Antibiotics are not effective, because the infection is caused by a virus, not by bacteria. Treatment may include:  Increased fluid intake. Sports drinks offer valuable electrolytes, sugars, and fluids.  Breathing heated mist or steam (vaporizer or shower).  Eating chicken soup or other clear broths, and maintaining good nutrition.  Getting plenty of rest.  Using gargles or lozenges for comfort.  Controlling fevers with ibuprofen or  acetaminophen as directed by your caregiver.  Increasing usage of your inhaler if you have asthma.  Return to work when your temperature has returned to normal.   Follow-up instructions: Followup with your primary care doctor in 4 days if your symptoms persist.  Your more than welcome to return to the emergency department if symptoms worsen or become concerning.  Return instructions:  Please return to the Emergency Department if you do not get better, if you get worse, or new symptoms OR  - Fever (temperature greater than 101.41F)  - Bleeding that does not stop with holding pressure to the area    -Severe pain (please note that you may be more sore the day after your accident)  - Chest Pain  - Difficulty breathing (worsening shortness of breath with sputum production may  be a sign of pneumonia.   - Severe nausea or vomiting  - Inability to tolerate food and liquids  - Passing out  - Skin becoming red around your wounds  - Change in mental status (confusion or lethargy)  - New numbness or weakness     -You develop fever, swollen neck glands, pain with swallowing or white areas on the back of your throat. This may be a sign of strep throat.  Please return if you have any other emergent concerns.  Additional Information: Please do not insert anything into your ear that is smaller than your elbow.  This includes QTips, keys, hair pins, etc.    Your vital signs today were: BP (!) 144/90 (BP Location: Left Arm)    Pulse 73  Temp 97.9 F (36.6 C) (Oral)    Resp 18    Ht 5' (1.524 m)    Wt 108.9 kg (240 lb)    SpO2 100%    BMI 46.87 kg/m  If your blood pressure (BP) was elevated above 135/85 this visit, please have this repeated by your doctor within one month.

## 2017-02-25 NOTE — ED Provider Notes (Signed)
[facility] EMERGENCY DEPARTMENT Provider Note   CSN: [id number] Arrival date & time: 02/25/17  2048     History   Chief Complaint Chief Complaint  Patient presents with  . URI    HPI Marissa Lin is a 46 y.o. female resents to the emergency department today for URI like symptoms.  Patient states that since yesterday she has been having sinus congestion, left ear pain and postnasal drip.  She states over the last week she has had itchy/watery eyes and sneezing. She admits to environmental allergies but has not been taking anything for these symptoms.  She has taken Tylenol for her ear pain which has provided her relief.  Nothing makes the patient's symptoms better or worse.  No sick contacts.  She denies any fever, chills, cough, chest pain, shortness of breath, sinus pressure/pain, sore throat, neck stiffness, rash, vertigo, visual changes, jaw claudication.  HPI  Past Medical History:  Diagnosis Date  . Arthritis   . Carpal tunnel syndrome   . Metatarsal deformity   . Migraine   . Plantar fasciitis     Patient Active Problem List   Diagnosis Date Noted  . Plantar fasciitis of left foot 06/13/2014  . Onychomycosis due to dermatophyte 06/13/2014  . Tinea pedis of both feet 06/13/2014  . 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 ABDOMINAL HYSTERECTOMY    . CESAREAN SECTION      OB History    No data available       Home Medications    Prior to Admission medications   Medication Sig Start Date End Date Taking? Authorizing Provider  meclizine (ANTIVERT) 12.5 MG tablet Take 12.5 mg by mouth 3 (three) times daily as needed for dizziness or nausea.    [provider]    Family History No family history on file.  Social History Social History   Tobacco Use  . Smoking status: Never Smoker  . Smokeless tobacco: Never Used  Substance Use Topics  . Alcohol use: No    Alcohol/week:  0.0 oz  . Drug use: No     Allergies   Diflucan [fluconazole]   Review of Systems Review of Systems  Constitutional: Negative for chills and fever.  HENT: Positive for congestion, postnasal drip, rhinorrhea and sneezing. Negative for sore throat.   Eyes: Negative for visual disturbance.  Respiratory: Negative for cough.   Musculoskeletal: Negative for neck stiffness.  Skin: Negative for rash.  All other systems reviewed and are negative.    Physical Exam Updated Vital Signs BP (!) 144/90 (BP Location: Left Arm)   Pulse 73   Temp 97.9 F (36.6 C) (Oral)   Resp 18   Ht 5' (1.524 m)   Wt 108.9 kg (240 lb)   SpO2 100%   BMI 46.87 kg/m   Physical Exam  Constitutional: She appears well-developed and well-nourished. No distress.  HENT:  Head: Normocephalic and atraumatic.  Right Ear: Hearing, tympanic membrane, external ear and ear canal normal. No foreign bodies. No mastoid tenderness. Tympanic membrane is not injected, not perforated, not erythematous, not retracted and not bulging.  Left Ear: Hearing, tympanic membrane, external ear and ear canal normal. No foreign bodies. No mastoid tenderness. Tympanic membrane is not injected, not perforated, not erythematous, not retracted and not bulging.  Nose: Mucosal edema and rhinorrhea present. No sinus tenderness, septal deviation or nasal septal hematoma.  No foreign bodies. Right sinus exhibits no maxillary sinus tenderness and  no frontal sinus tenderness. Left sinus exhibits no maxillary sinus tenderness and no frontal sinus tenderness.  Mouth/Throat: Uvula is midline, oropharynx is clear and moist and mucous membranes are normal. No tonsillar exudate.  The patient has normal phonation and is in control of secretions. No stridor.  Midline uvula without edema. Soft palate rises symmetrically.  No tonsillar erythema or exudates. No PTA. Some cobblestoning. Tongue protrusion is normal. No trismus. No creptius on neck palpation and  patient has good dentition. No gingival erythema or fluctuance noted. Mucus membranes moist. No mastoid tenderness or erythema.  Eyes: Conjunctivae, EOM and lids are normal. Pupils are equal, round, and reactive to light. Right eye exhibits no discharge. Left eye exhibits no discharge. Right conjunctiva is not injected. Left conjunctiva is not injected. No scleral icterus.  Neck: Trachea normal, normal range of motion, full passive range of motion without pain and phonation normal. Neck supple. No spinous process tenderness and no muscular tenderness present. No neck rigidity. No tracheal deviation and normal range of motion present.  No meningismus  Cardiovascular: Normal rate, regular rhythm and intact distal pulses.  No murmur heard. Pulses:      Radial pulses are 2+ on the right side, and 2+ on the left side.       Dorsalis pedis pulses are 2+ on the right side, and 2+ on the left side.       Posterior tibial pulses are 2+ on the right side, and 2+ on the left side.  No lower extremity swelling or edema. Calves symmetric in size bilaterally.  Pulmonary/Chest: Effort normal and breath sounds normal. No stridor. She has no wheezes. She exhibits no tenderness.  Abdominal: Soft. Bowel sounds are normal. There is no tenderness. There is no rebound and no guarding.  Musculoskeletal: She exhibits no edema.  Lymphadenopathy:       Head (right side): No submental, no submandibular, no tonsillar and no preauricular adenopathy present.       Head (left side): No submental, no submandibular, no tonsillar and no preauricular adenopathy present.    She has no cervical adenopathy.  Neurological: She is alert.  Skin: Skin is warm and dry. No rash noted. She is not diaphoretic.  Psychiatric: She has a normal mood and affect.  Nursing note and vitals reviewed.    ED Treatments / Results  Labs (all labs ordered are listed, but only abnormal results are displayed) Labs Reviewed - No data to  display  EKG  EKG Interpretation None       Radiology No results found.  Procedures Procedures (including critical care time)  Medications Ordered in ED Medications  acetaminophen (TYLENOL) tablet 650 mg (650 mg Oral Given 02/25/17 2344)     Initial Impression / Assessment and Plan / ED Course  I have reviewed the triage vital signs and the nursing notes.  Pertinent labs & imaging results that were available during my care of the patient were reviewed by me and considered in my medical decision making (see chart for details).     46 year old female with viral URI versus allergic rhinitis.  Patients exam is reassuring as above.  Will prescribe the patient with Zyrtec, Flonase and Mucinex.  Discussed that antibiotic not indicated for viral infections. Patient verbalizes understanding and is agreeable with plan. Return precautions discussed. Pt is hemodynamically stable & in NAD prior to dc.  Final Clinical Impressions(s) / ED Diagnoses   Final diagnoses:  Viral upper respiratory tract infection    ED Discharge  Orders        Ordered    fluticasone (FLONASE) 50 MCG/ACT nasal spray  Daily     02/25/17 2340    cetirizine (ZYRTEC) 10 MG tablet  Daily     02/25/17 2340       Princella PellegriniMaczis, Danissa Rundle M, PA-C 02/26/17 0114    Doug SouJacubowitz, Sam, MD 02/27/17 1453

## 2017-02-25 NOTE — ED Triage Notes (Addendum)
C/o pain to left ear, top of head and sore throat, nasal drainage-NAD-steady gait

## 2017-05-17 ENCOUNTER — Other Ambulatory Visit: Payer: Self-pay

## 2017-05-17 ENCOUNTER — Encounter (HOSPITAL_BASED_OUTPATIENT_CLINIC_OR_DEPARTMENT_OTHER): Payer: Self-pay | Admitting: *Deleted

## 2017-05-17 ENCOUNTER — Emergency Department (HOSPITAL_BASED_OUTPATIENT_CLINIC_OR_DEPARTMENT_OTHER)
Admission: EM | Admit: 2017-05-17 | Discharge: 2017-05-17 | Disposition: A | Discharge status: Home / Self Care | Payer: Managed Care, Other (non HMO) | Attending: Emergency Medicine | Admitting: Emergency Medicine

## 2017-05-17 ENCOUNTER — Emergency Department (HOSPITAL_BASED_OUTPATIENT_CLINIC_OR_DEPARTMENT_OTHER): Payer: Managed Care, Other (non HMO)

## 2017-05-17 DIAGNOSIS — Y999 Unspecified external cause status: Secondary | ICD-10-CM | POA: Insufficient documentation

## 2017-05-17 DIAGNOSIS — S4991XA Unspecified injury of right shoulder and upper arm, initial encounter: Secondary | ICD-10-CM | POA: Diagnosis present

## 2017-05-17 DIAGNOSIS — M5410 Radiculopathy, site unspecified: Secondary | ICD-10-CM | POA: Insufficient documentation

## 2017-05-17 DIAGNOSIS — X503XXA Overexertion from repetitive movements, initial encounter: Secondary | ICD-10-CM | POA: Insufficient documentation

## 2017-05-17 DIAGNOSIS — Y929 Unspecified place or not applicable: Secondary | ICD-10-CM | POA: Diagnosis not present

## 2017-05-17 DIAGNOSIS — Z79899 Other long term (current) drug therapy: Secondary | ICD-10-CM | POA: Diagnosis not present

## 2017-05-17 DIAGNOSIS — Y939 Activity, unspecified: Secondary | ICD-10-CM | POA: Diagnosis not present

## 2017-05-17 DIAGNOSIS — S46911A Strain of unspecified muscle, fascia and tendon at shoulder and upper arm level, right arm, initial encounter: Secondary | ICD-10-CM | POA: Diagnosis not present

## 2017-05-17 MED ORDER — IBUPROFEN 600 MG PO TABS: 600.0000 mg | ORAL_TABLET | Freq: Four times a day (QID) | ORAL | 0 refills | Status: DC | PRN

## 2017-05-17 MED ORDER — METHOCARBAMOL 500 MG PO TABS: 500.0000 mg | ORAL_TABLET | Freq: Two times a day (BID) | ORAL | 0 refills | Status: DC

## 2017-05-17 NOTE — Discharge Instructions (Signed)
Take ibuprofen every 6 hours as needed for muscle pain.  Take Robaxin twice daily as needed for muscle pain or spasms.  Do not drive or operate machinery while taking use a heating pad 3-4 times daily alternating 20 minutes on, 20 minutes off.  He can also alternate with ice.  Attempt the stretches I discussed a few times a day as well.  You may also benefit from a massage.  Please return to the emergency department if you develop any new or worsening symptoms including chest pain or shortness of breath.

## 2017-05-17 NOTE — ED Provider Notes (Signed)
[facility] EMERGENCY DEPARTMENT Provider Note   CSN: [id number] Arrival date & time: 05/17/17  [id number]     History   Chief Complaint Chief Complaint  Patient presents with  . Shoulder Pain    HPI Marissa Lin is a 47 y.o. female with history of carpal tunnel syndrome who presents with a one-week history of right shoulder pain.  Patient reports her pain is worse with movement and laying on the shoulder.  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  She is right-handed.  She has had pain radiating down her arm to her hand as well as numbness and tingling.  She denies any chest pain.  She had an episode of shortness of breath yesterday, however she relates this to possible anxiety.  She has not had any shortness of breath since.  She has not tried anything at home for her symptoms.  She is currently undergoing physical therapy for her carpal tunnel.  She reports trying yoga for the first time 2 weeks ago, but not since.  Patient also reports 3-week history of a bump in the middle of her back.  It is tender.  HPI  Past Medical History:  Diagnosis Date  . Arthritis   . Carpal tunnel syndrome   . Metatarsal deformity   . Migraine   . Plantar fasciitis     Patient Active Problem List   Diagnosis Date Noted  . Plantar fasciitis of left foot 06/13/2014  . Onychomycosis due to dermatophyte 06/13/2014  . Tinea pedis of both feet 06/13/2014  . 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 ABDOMINAL HYSTERECTOMY    . CESAREAN SECTION      OB History    No data available       Home Medications    Prior to Admission medications   Medication Sig Start Date End Date Taking? Authorizing Provider  cetirizine (ZYRTEC) 10 MG tablet Take 1 tablet (10 mg total) by mouth daily. 02/25/17   Maczis, Elmer SowMichael M, PA-C  fluticasone (FLONASE) 50 MCG/ACT nasal spray Place 2 sprays into both nostrils daily. 02/25/17   Maczis, Elmer SowMichael M, PA-C    ibuprofen (ADVIL,MOTRIN) 600 MG tablet Take 1 tablet (600 mg total) by mouth every 6 (six) hours as needed. 05/17/17   Shateria Paternostro, Waylan BogaAlexandra M, PA-C  meclizine (ANTIVERT) 12.5 MG tablet Take 12.5 mg by mouth 3 (three) times daily as needed for dizziness or nausea.    [provider]  methocarbamol (ROBAXIN) 500 MG tablet Take 1 tablet (500 mg total) by mouth 2 (two) times daily. 05/17/17   Emi HolesLaw, Cleburn Maiolo M, PA-C    Family History History reviewed. No pertinent family history.  Social History Social History   Tobacco Use  . Smoking status: Never Smoker  . Smokeless tobacco: Never Used  Substance Use Topics  . Alcohol use: No    Alcohol/week: 0.0 oz  . Drug use: No     Allergies   Diflucan [fluconazole]   Review of Systems Review of Systems  Constitutional: Negative for fever.  Musculoskeletal: Positive for arthralgias (R shoulder) and myalgias.  Neurological: Positive for numbness.     Physical Exam Updated Vital Signs BP 112/65 (BP Location: Right Arm)   Pulse 62   Temp 98 F (36.7 C) (Oral)   Resp 18   Ht 5\' 1"  (1.549 m)   Wt 106.6 kg (235 lb)   SpO2 99%   BMI 44.40 kg/m   Physical Exam  Constitutional:  She appears well-developed and well-nourished. No distress.  HENT:  Head: Normocephalic and atraumatic.  Mouth/Throat: Oropharynx is clear and moist. No oropharyngeal exudate.  Eyes: Conjunctivae are normal. Pupils are equal, round, and reactive to light. Right eye exhibits no discharge. Left eye exhibits no discharge. No scleral icterus.  Neck: Normal range of motion. Neck supple. No thyromegaly present.  Cardiovascular: Normal rate, regular rhythm, normal heart sounds and intact distal pulses. Exam reveals no gallop and no friction rub.  No murmur heard. Pulmonary/Chest: Effort normal and breath sounds normal. No stridor. No respiratory distress. She has no wheezes. She has no rales. She exhibits no tenderness.  Abdominal: Soft. Bowel sounds are normal. She  exhibits no distension. There is no tenderness. There is no rebound and no guarding.  Musculoskeletal: She exhibits no edema.  FROM of right shoulder with some pain, bony tenderness throughout, but majority of pain most severe at right upper trapezius, tenderness over clavicle; negative empty can test  Lymphadenopathy:    She has no cervical adenopathy.  Neurological: She is alert. Coordination normal.  Skin: Skin is warm and dry. No rash noted. She is not diaphoretic. No pallor.  Cystic-like papule, mildly tender, no erythema over mid thoracic spine  Psychiatric: She has a normal mood and affect.  Nursing note and vitals reviewed.    ED Treatments / Results  Labs (all labs ordered are listed, but only abnormal results are displayed) Labs Reviewed - No data to display  EKG  EKG Interpretation  Date/Time:  Sunday May 17 2017 10:47:56 EST Ventricular Rate:  55 PR Interval:    QRS Duration: 111 QT Interval:  433 QTC Calculation: 415 R Axis:   55 Text Interpretation:  Sinus rhythm Borderline T abnormalities, anterior leads Since last tracing rate slower Confirmed by Doug Sou [phone]) on 05/17/2017 10:57:46 AM       Radiology Dg Shoulder Right  Result Date: 05/17/2017 CLINICAL DATA:  Acute right shoulder pain for 1 week. No known injury. Initial encounter. EXAM: RIGHT SHOULDER - 2+ VIEW COMPARISON:  None. FINDINGS: There is no evidence of fracture or dislocation. There is no evidence of arthropathy or other focal bone abnormality. Soft tissues are unremarkable. IMPRESSION: Negative. Electronically Signed   By: Harmon Pier M.D.   On: 05/17/2017 10:33    Procedures Procedures (including critical care time)  Medications Ordered in ED Medications - No data to display   Initial Impression / Assessment and Plan / ED Course  I have reviewed the triage vital signs and the nursing notes.  Pertinent labs & imaging results that were available during my care of the patient  were reviewed by me and considered in my medical decision making (see chart for details).     Patient with radicular right shoulder pain with muscle pain on palpation and spasm.  X-ray is negative.  EKG is unchanged from past, NSR with borderline T wave abnormalities.  Patient without chest pain or shortness of breath.  Shoulder pain is worse with movement.  Suspect musculoskeletal.  Doubt ACS.  Patient to follow-up with orthopedics if symptoms are not improving.  Supportive treatment discussed with ibuprofen, Robaxin, heat/ice/stretching, massage.  For patient's probable sebaceous cyst, I offered I&D or conservative treatment with warm compresses.  She opts for warm compresses at this time and plans to follow-up if needed. Return precautions discussed.  Patient understands and agrees with plan.  Patient vitals stable throughout ED course and discharged in satisfactory condition.  Final Clinical Impressions(s) / ED  Diagnoses   Final diagnoses:  Radiculopathy, unspecified spinal region  Strain of right shoulder, initial encounter    ED Discharge Orders        Ordered    ibuprofen (ADVIL,MOTRIN) 600 MG tablet  Every 6 hours PRN     05/17/17 1133    methocarbamol (ROBAXIN) 500 MG tablet  2 times daily     05/17/17 [address] [place], [place] 05/17/17 1137    Doug Sou, MD 05/17/17 1651

## 2017-05-17 NOTE — ED Triage Notes (Signed)
Pt c/o right shoulder pain which radiates down right arm with movt x  1 week

## 2017-12-27 ENCOUNTER — Encounter (HOSPITAL_BASED_OUTPATIENT_CLINIC_OR_DEPARTMENT_OTHER): Payer: Self-pay | Admitting: Emergency Medicine

## 2017-12-27 ENCOUNTER — Emergency Department (HOSPITAL_BASED_OUTPATIENT_CLINIC_OR_DEPARTMENT_OTHER)
Admission: EM | Admit: 2017-12-27 | Discharge: 2017-12-27 | Disposition: A | Discharge status: Home / Self Care | Payer: Managed Care, Other (non HMO) | Attending: Emergency Medicine | Admitting: Emergency Medicine

## 2017-12-27 ENCOUNTER — Emergency Department (HOSPITAL_BASED_OUTPATIENT_CLINIC_OR_DEPARTMENT_OTHER): Payer: Managed Care, Other (non HMO)

## 2017-12-27 ENCOUNTER — Other Ambulatory Visit: Payer: Self-pay

## 2017-12-27 DIAGNOSIS — M79604 Pain in right leg: Secondary | ICD-10-CM | POA: Diagnosis present

## 2017-12-27 DIAGNOSIS — Z79899 Other long term (current) drug therapy: Secondary | ICD-10-CM | POA: Diagnosis not present

## 2017-12-27 DIAGNOSIS — M1711 Unilateral primary osteoarthritis, right knee: Secondary | ICD-10-CM | POA: Diagnosis not present

## 2017-12-27 MED ORDER — IBUPROFEN 400 MG PO TABS
600.0000 mg | ORAL_TABLET | Freq: Once | ORAL | Status: AC
  Administered 2017-12-27: 600 mg via ORAL
  Filled 2017-12-27: qty 1

## 2017-12-27 MED ORDER — CYCLOBENZAPRINE HCL 10 MG PO TABS: 10.0000 mg | ORAL_TABLET | Freq: Two times a day (BID) | ORAL | 0 refills | Status: DC | PRN

## 2017-12-27 MED ORDER — PREDNISONE 10 MG (21) PO TBPK: ORAL_TABLET | Freq: Every day | ORAL | 0 refills | Status: DC

## 2017-12-27 NOTE — Discharge Instructions (Signed)
Thank you for allowing me to care for you today in the Emergency Department.   Your x-ray of your right knee was consistent with tricompartmental osteoarthritis of the right knee.  Osteoarthritis is from wear and tear of the joints.  Sometimes you can get flares of this from overuse.  Since you have been doing Zumba, you may want to try swimming or water aerobics because this will not put increased pressure on the right knee.  Wear the Ace wrap to the right knee to provide compression.  This may help with pain.  When you are sitting and resting, elevate your right leg so that your toes are at the level of your nose.  Apply ice for 15 to 20 minutes as frequently as needed.  For the swelling in her ankles, you can also wear compression stockings, which are available over-the-counter.  For pain control, you can take 600 mg of ibuprofen every 6 hours with food.  You can try doing this around the clock for 4 to 5 days to see if there is a significant improvement in her pain.  For prednisone, take 6 tabs by mouth daily  for 2 days, then 5 tabs for 2 days, then 4 tabs for 2 days, then 3 tabs for 2 days, 2 tabs for 2 days, then 1 tab by mouth daily for 2 days.  I recommend starting this medication tomorrow and start tonight.  You can also try taking 1 tablet of Flexeril up to 2 times daily.  This is a muscle relaxer and can help with muscle pain and spasms.  Do not take this medication if after work or drive because it can cause you to be drowsy.  If your symptoms do not start to improve with this regimen in the next week, you can call and schedule follow-up appointment with Dr. Pearletha ForgeHudnall.  He works in sports medicine and orthopedics.  Return to the emergency department if you develop fever, chills, or if the knee becomes red or hot to the touch.

## 2017-12-27 NOTE — ED Notes (Signed)
Pt in radiology 

## 2017-12-27 NOTE — ED Provider Notes (Signed)
[facility] EMERGENCY DEPARTMENT Provider Note   CSN: [id number] Arrival date & time: 12/27/17  1624     History   Chief Complaint Chief Complaint  Patient presents with  . Leg Pain    HPI Marissa Lin is a 47 y.o. female with a history of obesity, plantar fasciitis, arthritis, and carpal tunnel syndrome who presents to the emergency department with a chief complaint of right knee pain.  Patient endorses constant, worsening right knee pain that began approximately 1 month ago.  No known trauma or injury.  States that the pain is in the posterior part of the knee, but has also had pain in her thigh and calf.  She characterizes the pain as throbbing.  She states that when she wakes in the morning that her right knee feels stiff and takes several minutes before she is able to move it.  No known falls or injuries.  She denies right hip or ankle pain.  She also reports pain and swelling in the left ankle and foot that began yesterday after she was at a birthday party where she had to stand for long period of time.  No left knee or hip pain.  She denies numbness or weakness, fever or chills, redness or swelling.  He has been treating her symptoms with ibuprofen, which she says improves the pain, but does not resolve it.  Last dose of ibuprofen was last night.  She says she did have some sharp chest pain several days ago, which has since resolved. She also reports some vague shortness of breath several days ago, denies shortness of breath, chest pain, cough at this time.  No arthralgias in her bilateral upper extremities, neck, or back.  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  Family history includes her mother and twin brother who has previously had blood clots.  No recent long travel.  No recent surgery.  No active history of cancer.  No recent immobilization.  The history is provided by the patient. No language interpreter was  used.    Past Medical History:  Diagnosis Date  . Arthritis   . Carpal tunnel syndrome   . Metatarsal deformity   . Migraine   . Plantar fasciitis     Patient Active Problem List   Diagnosis Date Noted  . Plantar fasciitis of left foot 06/13/2014  . Onychomycosis due to dermatophyte 06/13/2014  . Tinea pedis of both feet 06/13/2014  . 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 ABDOMINAL HYSTERECTOMY    . CESAREAN SECTION       OB History   None      Home Medications    Prior to Admission medications   Medication Sig Start Date End Date Taking? Authorizing Provider  cetirizine (ZYRTEC) 10 MG tablet Take 1 tablet (10 mg total) by mouth daily. 02/25/17   Maczis, Elmer Sow, PA-C  cyclobenzaprine (FLEXERIL) 10 MG tablet Take 1 tablet (10 mg total) by mouth 2 (two) times daily as needed for muscle spasms. 12/27/17   Lemoyne Scarpati A, PA-C  fluticasone (FLONASE) 50 MCG/ACT nasal spray Place 2 sprays into both nostrils daily. 02/25/17   Maczis, Elmer Sow, PA-C  ibuprofen (ADVIL,MOTRIN) 600 MG tablet Take 1 tablet (600 mg total) by mouth every 6 (six) hours as needed. 05/17/17   Law, Waylan Boga, PA-C  meclizine (ANTIVERT) 12.5 MG tablet Take 12.5 mg by  mouth 3 (three) times daily as needed for dizziness or nausea.    [provider]  methocarbamol (ROBAXIN) 500 MG tablet Take 1 tablet (500 mg total) by mouth 2 (two) times daily. 05/17/17   Law, Waylan Boga, PA-C  predniSONE (STERAPRED UNI-PAK 21 TAB) 10 MG (21) TBPK tablet Take by mouth daily. Take 6 tabs by mouth daily  for 2 days, then 5 tabs for 2 days, then 4 tabs for 2 days, then 3 tabs for 2 days, 2 tabs for 2 days, then 1 tab by mouth daily for 2 days 12/27/17   Barkley Boards, PA-C    Family History History reviewed. No pertinent family history.  Social History Social History   Tobacco Use  . Smoking status: Never Smoker  . Smokeless tobacco: Never Used  Substance  Use Topics  . Alcohol use: No    Alcohol/week: 0.0 standard drinks  . Drug use: No     Allergies   Diflucan [fluconazole]   Review of Systems Review of Systems  Constitutional: Negative for activity change, chills and fever.  Respiratory: Negative for shortness of breath.   Cardiovascular: Negative for chest pain.  Gastrointestinal: Negative for abdominal pain.  Genitourinary: Negative for dysuria.  Musculoskeletal: Positive for arthralgias, gait problem, joint swelling and myalgias. Negative for back pain, neck pain and neck stiffness.  Skin: Negative for rash.  Allergic/Immunologic: Negative for immunocompromised state.  Neurological: Negative for weakness, numbness and headaches.  Psychiatric/Behavioral: Negative for confusion.     Physical Exam Updated Vital Signs BP 124/74 (BP Location: Right Arm)   Pulse 80   Temp 98.2 F (36.8 C) (Oral)   Resp 18   Ht 5' (1.524 m)   Wt 104.3 kg   SpO2 99%   BMI 44.92 kg/m   Physical Exam  Constitutional: No distress.  Obese body habitus.  HENT:  Head: Normocephalic.  Eyes: Conjunctivae are normal.  Neck: Neck supple.  Cardiovascular: Normal rate, regular rhythm, normal heart sounds and intact distal pulses. Exam reveals no gallop and no friction rub.  No murmur heard. Pulmonary/Chest: Effort normal. No stridor. No respiratory distress. She has no wheezes. She has no rales. She exhibits no tenderness.  Abdominal: Soft. She exhibits no distension and no mass. There is no rebound and no guarding. No hernia.  Obese abdomen.   Musculoskeletal: She exhibits edema and tenderness. She exhibits no deformity.  Varicose veins noted in the bilateral lower extremities.  Diffusely tender to palpation to the bilateral hips, thighs, knees, and ankles.  No tenderness to the left calf.  Right calf is tender to palpation, but no erythema or warmth.  Mild, nonpitting edema to the bilateral lower legs.  DP pulses are 2+ and symmetric.   Sensation is intact and equal throughout.  5 out of 5 strength against resistance with dorsiflexion plantarflexion.  Limping gait.  Able to bear weight on the bilateral lower extremities.  Tender to palpation to the right posterior knee.  She also has mild tenderness to the medial and lateral joint lines.  Tender to palpation over the right lateral hip.  Negative anterior posterior drawer test.  Negative valgus and varus stress test.   Neurological: She is alert.  Skin: Skin is warm. No rash noted.  Psychiatric: Her behavior is normal.  Nursing note and vitals reviewed.  ED Treatments / Results  Labs (all labs ordered are listed, but only abnormal results are displayed) Labs Reviewed - No data to display  EKG None  Radiology  [place] Venous Img Lower Right (dvt Study)  Result Date: 12/27/2017 CLINICAL DATA:  Ankle swelling, pain x1 month EXAM: RIGHT LOWER EXTREMITY VENOUS DOPPLER ULTRASOUND TECHNIQUE: Gray-scale sonography with compression, as well as color and duplex ultrasound, were performed to evaluate the deep venous system from the level of the common femoral vein through the popliteal and proximal calf veins. COMPARISON:  None FINDINGS: Normal compressibility of the common femoral, superficial femoral, and popliteal veins, as well as the proximal calf veins. No filling defects to suggest DVT on grayscale or color Doppler imaging. Doppler waveforms show normal direction of venous flow, normal respiratory phasicity and response to augmentation. Visualized segments of the saphenous venous system normal in caliber and compressibility. Survey views of the contralateral common femoral vein are unremarkable. IMPRESSION: No evidence of right lower extremity deep vein thrombosis. Electronically Signed   By: Corlis Leak  Hassell M.D.   On: 12/27/2017 19:41   Dg Knee Complete 4 Views Right  Result Date: 12/27/2017 CLINICAL DATA:  Acute RIGHT knee pain. No known injury. Initial encounter. EXAM: RIGHT KNEE -  COMPLETE 4+ VIEW COMPARISON:  None. FINDINGS: No acute fracture, subluxation or dislocation. No joint effusion. Mild tricompartmental degenerative changes, greatest in the patellofemoral compartment. No suspicious focal bony lesions identified. IMPRESSION: 1. No acute abnormality 2. Mild tricompartmental degenerative changes. Electronically Signed   By: Harmon PierJeffrey  Hu M.D.   On: 12/27/2017 18:52    Procedures Procedures (including critical care time)  Medications Ordered in ED Medications  ibuprofen (ADVIL,MOTRIN) tablet 600 mg (600 mg Oral Given 12/27/17 1800)     Initial Impression / Assessment and Plan / ED Course  I have reviewed the triage vital signs and the nursing notes.  Pertinent labs & imaging results that were available during my care of the patient were reviewed by me and considered in my medical decision making (see chart for details).     47 year old female with a history of obesity, plantar fasciitis, arthritis, and carpal tunnel syndrome and with atraumatic right knee pain for 1 month that has been constant and worsening since onset.  Family history is positive for VTE and her twin brother and mother.  Exam is somewhat conflicting as the patient is diffusely tender to palpation almost entirely throughout the bilateral lower extremities, but she does appear to have increased tenderness with palpation to the right posterior knee.  She is neurovascularly intact.  She does have numerous varicose veins to the bilateral lower extremities.  No overlying warmth or erythema to the bilateral lower extremities.  She has several risk factors for VTE including family history, varicosities, and sedentary occupation.  Will order DVT study.  Also, given her body habitus, will order x-ray of the right knee to assess for osteoarthritis.  Pain controlled in the ED with ibuprofen.   DVT study is negative.  Right knee x-ray with tricompartmental osteoarthritis.  Ace wrap applied in the ED.   Recommended conservative management with anti-inflammatories and RICE therapy.  Referral to sports medicine and orthopedics provided.  We also discussed water aerobics and swimming as a way to continue to get exercise that would not put as much pressure on the knee.  Strict return precautions to the emergency department given.  She is hemodynamically stable and in no acute distress.  She is safe for discharge home with outpatient follow-up at this time.  Final Clinical Impressions(s) / ED Diagnoses   Final diagnoses:  Osteoarthritis of right knee, unspecified osteoarthritis type    ED Discharge Orders  Ordered    predniSONE (STERAPRED UNI-PAK 21 TAB) 10 MG (21) TBPK tablet  Daily     12/27/17 2017    cyclobenzaprine (FLEXERIL) 10 MG tablet  2 times daily PRN     12/27/17 2017           Calaya Gildner A, PA-C 12/27/17 2021    Arby Barrette, MD 12/27/17 [phone]

## 2017-12-27 NOTE — ED Triage Notes (Signed)
Patient states that she is having pain with her legs and feet. The patient patient states that she has pain and swelling to her left leg and heal - which comes and go - Her right leg has hurt longer and she has pain to her right thigh and back to her knee x 1 month. Patient states that " I can't hardly wrong and I cant put no pressure on it"

## 2018-01-11 ENCOUNTER — Ambulatory Visit (INDEPENDENT_AMBULATORY_CARE_PROVIDER_SITE_OTHER): Payer: Managed Care, Other (non HMO) | Admitting: Family Medicine

## 2018-01-11 ENCOUNTER — Encounter: Payer: Self-pay | Admitting: Family Medicine

## 2018-01-11 VITALS — BP 144/92 | HR 79 | Ht 60.0 in | Wt 236.0 lb

## 2018-01-11 DIAGNOSIS — M1711 Unilateral primary osteoarthritis, right knee: Secondary | ICD-10-CM | POA: Diagnosis not present

## 2018-01-11 MED ORDER — METHYLPREDNISOLONE ACETATE 40 MG/ML IJ SUSP
40.0000 mg | Freq: Once | INTRAMUSCULAR | Status: AC
  Administered 2018-01-11: 40 mg via INTRA_ARTICULAR

## 2018-01-11 NOTE — Patient Instructions (Signed)
Your pain is due to arthritis with a bakers cyst. These are the different medications you can take for this: Tylenol 500mg  1-2 tabs three times a day for pain. Capsaicin, aspercreme, or biofreeze topically up to four times a day may also help with pain. Some supplements that may help for arthritis: Boswellia extract, curcumin, pycnogenol Aleve 1-2 tabs twice a day with food Cortisone injections are an option - you were given this today. It's important that you continue to stay active. Straight leg raises, knee extensions 3 sets of 10 once a day (add ankle weight if these become too easy). Consider physical therapy to strengthen muscles around the joint that hurts to take pressure off of the joint itself. Shoe inserts with good arch support may be helpful. Heat or ice 15 minutes at a time 3-4 times a day as needed to help with pain. Water aerobics and cycling with low resistance are the best two types of exercise for arthritis though any exercise is ok as long as it doesn't worsen the pain. Follow up with me in 1 month or as needed (call me sooner if you're struggling and we can go after the cyst directly though this is rarely needed).

## 2018-01-11 NOTE — Progress Notes (Signed)
PCP: Wilburn Mylar, MD  Subjective:   HPI: Patient is a 47 y.o. female here for right knee pain.  Patient reports intermittent 9/10 posterior right knee pain.  No known injury.  This is been going on for about a month.  She was evaluated in the emergency department for this on 12/27/2017 where x-rays were performed.  Today she continues to have pain that occasionally radiates into the lower leg and back of the thigh.  She has been taking ibuprofen 800 mg as needed for pain which is somewhat helpful.  She denies any localized swelling, erythema or bruising.  No skin changes.  Pt also had doppler US performed in ED to r/o DVT which was negative   Past Medical History:  Diagnosis Date  . Arthritis   . Carpal tunnel syndrome   . Metatarsal deformity   . Migraine   . Plantar fasciitis     Current Outpatient Medications on File Prior to Visit  Medication Sig Dispense Refill  . cetirizine (ZYRTEC) 10 MG tablet Take 1 tablet (10 mg total) by mouth daily. 30 tablet 0  . cyclobenzaprine (FLEXERIL) 10 MG tablet Take 1 tablet (10 mg total) by mouth 2 (two) times daily as needed for muscle spasms. 20 tablet 0  . fluticasone (FLONASE) 50 MCG/ACT nasal spray Place 2 sprays into both nostrils daily. 16 g 0  . ibuprofen (ADVIL,MOTRIN) 600 MG tablet Take 1 tablet (600 mg total) by mouth every 6 (six) hours as needed. 30 tablet 0  . meclizine (ANTIVERT) 12.5 MG tablet Take 12.5 mg by mouth 3 (three) times daily as needed for dizziness or nausea.    . methocarbamol (ROBAXIN) 500 MG tablet Take 1 tablet (500 mg total) by mouth 2 (two) times daily. 20 tablet 0  . predniSONE (STERAPRED UNI-PAK 21 TAB) 10 MG (21) TBPK tablet Take by mouth daily. Take 6 tabs by mouth daily  for 2 days, then 5 tabs for 2 days, then 4 tabs for 2 days, then 3 tabs for 2 days, 2 tabs for 2 days, then 1 tab by mouth daily for 2 days 42 tablet 0   No current facility-administered medications on file prior to visit.     Past  Surgical History:  Procedure Laterality Date  . ABDOMINAL HYSTERECTOMY    . CESAREAN SECTION      Allergies  Allergen Reactions  . Diflucan [Fluconazole]     Social History   Socioeconomic History  . Marital status: Married    Spouse name: Not on file  . Number of children: Not on file  . Years of education: Not on file  . Highest education level: Not on file  Occupational History  . Not on file  Social Needs  . Financial resource strain: Not on file  . Food insecurity:    Worry: Not on file    Inability: Not on file  . Transportation needs:    Medical: Not on file    Non-medical: Not on file  Tobacco Use  . Smoking status: Never Smoker  . Smokeless tobacco: Never Used  Substance and Sexual Activity  . Alcohol use: No    Alcohol/week: 0.0 standard drinks  . Drug use: No  . Sexual activity: Not on file  Lifestyle  . Physical activity:    Days per week: Not on file    Minutes per session: Not on file  . Stress: Not on file  Relationships  . Social connections:    Talks on phone:  Not on file    Gets together: Not on file    Attends religious service: Not on file    Active member of club or organization: Not on file    Attends meetings of clubs or organizations: Not on file    Relationship status: Not on file  . Intimate partner violence:    Fear of current or ex partner: Not on file    Emotionally abused: Not on file    Physically abused: Not on file    Forced sexual activity: Not on file  Other Topics Concern  . Not on file  Social History Narrative  . Not on file    No family history on file.  BP (!) 144/92   Pulse 79   Ht 5' (1.524 m)   Wt 236 lb (107 kg)   BMI 46.09 kg/m   Review of Systems: See HPI above.     Objective:  Physical Exam:  Gen: awake, alert, NAD, comfortable in exam room Pulm: breathing unlabored  Right Knee: - Inspection: no gross deformity. No swelling/effusion, erythema or bruising. Skin intact - Palpation: mild  medial joint line TTP - ROM: full active ROM with flexion and extension in knee and hip - Strength: 5/5 strength - Neuro/vasc: NV intact - Special Tests: - LIGAMENTS: negative Lachman's, no MCL or LCL laxity  -- MENISCUS: negative McMurray's -- PF JOINT: nml patellar mobility bilaterally.  negative patellar grind, negative patellar apprehension MSK [place]: limited US of the knee reveals a 26x41mm bakers cyst  Left knee: No deformity. FROM with 5/5 strength. No tenderness to palpation. NVI distally.   Assessment & Plan:  1.  Right knee pain secondary to arthritis.  xrays from the emergency department were independently reviewed today.  Patient has tricompartmental degenerative changes which are most advanced in the patellofemoral joint.  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  There is a 26 X 9 mm Baker's cyst on exam.  This is likely contributing to the posterior pain she is having.  There is no evidence of ligamentous or meniscal pathology on exam. - Steroid injection performed today as described below - Recommend ice 15 minutes 3-4 times daily - Continue NSAIDs as needed for pain - Consider getting a compression knee sleeve - Home exercises instructed today - Follow-up 6 weeks  Procedure performed: knee intraarticular corticosteroid injection; [place] assisted  Consent obtained and verified. Time-out conducted. Noted no overlying erythema, induration, or other signs of local infection. The Right medial joint space was identified with ultrasound and marked. The overlying skin was prepped in a sterile fashion. Topical analgesic spray: Ethyl chloride. Joint: Right knee Needle: 25-gauge, 1.5 inch Completed without difficulty. Meds: Depo-Medrol 1 cc, bupivacaine 3 cc  Advised to call if fevers/chills, erythema, induration, drainage, or persistent bleeding.

## 2018-02-11 ENCOUNTER — Ambulatory Visit: Payer: Managed Care, Other (non HMO) | Admitting: Family Medicine

## 2018-02-15 ENCOUNTER — Encounter: Payer: Self-pay | Admitting: Family Medicine

## 2018-02-15 ENCOUNTER — Ambulatory Visit (INDEPENDENT_AMBULATORY_CARE_PROVIDER_SITE_OTHER): Payer: Managed Care, Other (non HMO) | Admitting: Family Medicine

## 2018-02-15 ENCOUNTER — Ambulatory Visit: Payer: Managed Care, Other (non HMO) | Admitting: Family Medicine

## 2018-02-15 VITALS — BP 138/65 | HR 76 | Ht 60.0 in | Wt 235.0 lb

## 2018-02-15 DIAGNOSIS — M1711 Unilateral primary osteoarthritis, right knee: Secondary | ICD-10-CM

## 2018-02-15 DIAGNOSIS — M25561 Pain in right knee: Secondary | ICD-10-CM | POA: Diagnosis not present

## 2018-02-15 MED ORDER — DICLOFENAC SODIUM 75 MG PO TBEC: 75.0000 mg | DELAYED_RELEASE_TABLET | Freq: Two times a day (BID) | ORAL | 1 refills | Status: DC

## 2018-02-15 NOTE — Progress Notes (Signed)
PCP: Wilburn Mylar, MD  Subjective:   HPI: 10/7: Patient is a 47 y.o. female here for right knee pain.  Patient reports intermittent 9/10 posterior right knee pain.  No known injury.  This is been going on for about a month.  She was evaluated in the emergency department for this on 12/27/2017 where x-rays were performed.  Today she continues to have pain that occasionally radiates into the lower leg and back of the thigh.  She has been taking ibuprofen 800 mg as needed for pain which is somewhat helpful.  She denies any localized swelling, erythema or bruising.  No skin changes.  Pt also had doppler US performed in ED to r/o DVT which was negative  11/11: Patient here for follow-up for right knee pain.  She states that the steroid injection was very helpful for about 5 weeks.  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  About 1 to 2 weeks ago her pain returned.  She states she was twisting her foot trying to put on her sneaker and felt a pop in her knee.  Subsequently during the following day she noted pressure and tightness in the knee.  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  At its worst her pain is 8/10 and sharp.  She is taking ibuprofen which is somewhat helpful.  She feels that her knee has been more swollen.  She denies any erythema or bruising.  No numbness or tingling in the lower extremity.  No associated skin changes.  Past Medical History:  Diagnosis Date  . Arthritis   . Carpal tunnel syndrome   . Metatarsal deformity   . Migraine   . Plantar fasciitis     Current Outpatient Medications on File Prior to Visit  Medication Sig Dispense Refill  . cetirizine (ZYRTEC) 10 MG tablet Take 1 tablet (10 mg total) by mouth daily. 30 tablet 0  . fluticasone (FLONASE) 50 MCG/ACT nasal spray Place 2 sprays into both nostrils daily. 16 g 0  . ibuprofen (ADVIL,MOTRIN) 600 MG tablet Take 1 tablet (600 mg total) by mouth every 6 (six) hours as needed. 30 tablet 0   . montelukast (SINGULAIR) 10 MG tablet Take 10 mg by mouth at bedtime.     No current facility-administered medications on file prior to visit.     Past Surgical History:  Procedure Laterality Date  . ABDOMINAL HYSTERECTOMY    . CESAREAN SECTION      Allergies  Allergen Reactions  . Nitrofurantoin Rash  . Other Hives  . Diflucan [Fluconazole]     Social History   Socioeconomic History  . Marital status: Married    Spouse name: Not on file  . Number of children: Not on file  . Years of education: Not on file  . Highest education level: Not on file  Occupational History  . Not on file  Social Needs  . Financial resource strain: Not on file  . Food insecurity:    Worry: Not on file    Inability: Not on file  . Transportation needs:    Medical: Not on file    Non-medical: Not on file  Tobacco Use  . Smoking status: Never Smoker  . Smokeless tobacco: Never Used  Substance and Sexual Activity  . Alcohol use: No    Alcohol/week: 0.0 standard drinks  . Drug use: No  . Sexual activity: Not on file  Lifestyle  . Physical activity:  Days per week: Not on file    Minutes per session: Not on file  . Stress: Not on file  Relationships  . Social connections:    Talks on phone: Not on file    Gets together: Not on file    Attends religious service: Not on file    Active member of club or organization: Not on file    Attends meetings of clubs or organizations: Not on file    Relationship status: Not on file  . Intimate partner violence:    Fear of current or ex partner: Not on file    Emotionally abused: Not on file    Physically abused: Not on file    Forced sexual activity: Not on file  Other Topics Concern  . Not on file  Social History Narrative  . Not on file    No family history on file.  BP 138/65   Pulse 76   Ht 5' (1.524 m)   Wt 235 lb (106.6 kg)   BMI 45.90 kg/m   Review of Systems: See HPI above.     Objective:  Physical Exam:  Vital  signs reviewed GEN: Awake, alert, no acute distress Pulmonary: Breathing unlabored  Right knee: No obvious deformity or swelling or erythema Tenderness along the medial joint line and medial aspect of the knee Full range of motion 5/5 strength without pain No cruciate or collateral ligament laxity Negative McMurray MSK [place]: Limited ultrasound of the knee shows trace effusion  Left knee: Full range of motion 5/5 strength N/V intact distally   Assessment & Plan:  1.  Right knee pain- secondary to arthritis.  Patient had good response to the steroid injection, however new flare when putting on shoe.  Structurally, her knee is stable on exam. -Ice 15 minutes 3-4 times daily -NSAIDs- will place prescription for diclofenac 75 mg twice daily for pain -Patient instructed on home exercise program -Consider knee brace for support - We will have her follow-up in about 4 weeks.  In the meantime she is struggling with home exercise, she may contact the clinic and we will refer her to physical therapy

## 2018-02-15 NOTE — Patient Instructions (Signed)
Your pain is due to flare of arthritis. These are the different medications you can take for this: Tylenol 500mg  1-2 tabs three times a day for pain. Capsaicin, aspercreme, or biofreeze topically up to four times a day may also help with pain. Some supplements that may help for arthritis: Boswellia extract, curcumin, pycnogenol Diclofenac 75mg  twice a day with food - stop your ibuprofen while you're on this. It's important that you continue to stay active. Straight leg raises, knee extensions, hamstring curls 3 sets of 10 once a day (add ankle weight if these become too easy). Consider knee brace or sleeve for support. Consider physical therapy to strengthen muscles around the joint that hurts to take pressure off of the joint itself. Shoe inserts with good arch support may be helpful. Heat or ice 15 minutes at a time 3-4 times a day as needed to help with pain. Water aerobics and cycling with low resistance are the best two types of exercise for arthritis though any exercise is ok as long as it doesn't worsen the pain. Follow up with me in 1 month or as needed (call me sooner if you're struggling).

## 2018-02-16 ENCOUNTER — Encounter: Payer: Self-pay | Admitting: Family Medicine

## 2018-03-22 ENCOUNTER — Ambulatory Visit: Payer: Managed Care, Other (non HMO) | Admitting: Family Medicine

## 2018-04-07 ENCOUNTER — Other Ambulatory Visit: Payer: Self-pay

## 2018-04-07 ENCOUNTER — Encounter (HOSPITAL_BASED_OUTPATIENT_CLINIC_OR_DEPARTMENT_OTHER): Payer: Self-pay | Admitting: Emergency Medicine

## 2018-04-07 ENCOUNTER — Emergency Department (HOSPITAL_BASED_OUTPATIENT_CLINIC_OR_DEPARTMENT_OTHER)
Admission: EM | Admit: 2018-04-07 | Discharge: 2018-04-07 | Disposition: A | Discharge status: Home / Self Care | Payer: Managed Care, Other (non HMO) | Attending: Emergency Medicine | Admitting: Emergency Medicine

## 2018-04-07 DIAGNOSIS — Y999 Unspecified external cause status: Secondary | ICD-10-CM | POA: Diagnosis not present

## 2018-04-07 DIAGNOSIS — Y9389 Activity, other specified: Secondary | ICD-10-CM | POA: Diagnosis not present

## 2018-04-07 DIAGNOSIS — M7918 Myalgia, other site: Secondary | ICD-10-CM | POA: Diagnosis not present

## 2018-04-07 DIAGNOSIS — Y92410 Unspecified street and highway as the place of occurrence of the external cause: Secondary | ICD-10-CM | POA: Diagnosis not present

## 2018-04-07 DIAGNOSIS — Z79899 Other long term (current) drug therapy: Secondary | ICD-10-CM | POA: Insufficient documentation

## 2018-04-07 DIAGNOSIS — M549 Dorsalgia, unspecified: Secondary | ICD-10-CM | POA: Diagnosis present
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
## 2018-04-07 NOTE — Discharge Instructions (Addendum)
Take Motrin or Tylenol as needed as directed for pain.  Follow-up with your doctor if needed.

## 2018-06-17 ENCOUNTER — Other Ambulatory Visit (HOSPITAL_COMMUNITY): Payer: Self-pay | Admitting: General Surgery

## 2018-07-05 ENCOUNTER — Ambulatory Visit: Payer: Managed Care, Other (non HMO) | Admitting: Dietician

## 2018-07-08 ENCOUNTER — Emergency Department (HOSPITAL_BASED_OUTPATIENT_CLINIC_OR_DEPARTMENT_OTHER)
Admission: EM | Admit: 2018-07-08 | Discharge: 2018-07-08 | Disposition: A | Discharge status: Home / Self Care | Payer: Managed Care, Other (non HMO) | Attending: Emergency Medicine | Admitting: Emergency Medicine

## 2018-07-08 ENCOUNTER — Encounter: Payer: Managed Care, Other (non HMO) | Attending: General Surgery | Admitting: Skilled Nursing Facility1

## 2018-07-08 ENCOUNTER — Emergency Department (HOSPITAL_BASED_OUTPATIENT_CLINIC_OR_DEPARTMENT_OTHER): Payer: Managed Care, Other (non HMO)

## 2018-07-08 ENCOUNTER — Encounter (HOSPITAL_BASED_OUTPATIENT_CLINIC_OR_DEPARTMENT_OTHER): Payer: Self-pay | Admitting: *Deleted

## 2018-07-08 ENCOUNTER — Other Ambulatory Visit: Payer: Self-pay

## 2018-07-08 DIAGNOSIS — Z79899 Other long term (current) drug therapy: Secondary | ICD-10-CM | POA: Insufficient documentation

## 2018-07-08 DIAGNOSIS — B9789 Other viral agents as the cause of diseases classified elsewhere: Secondary | ICD-10-CM | POA: Diagnosis not present

## 2018-07-08 DIAGNOSIS — R05 Cough: Secondary | ICD-10-CM | POA: Insufficient documentation

## 2018-07-08 DIAGNOSIS — R51 Headache: Secondary | ICD-10-CM | POA: Diagnosis present

## 2018-07-08 DIAGNOSIS — R519 Headache, unspecified: Secondary | ICD-10-CM

## 2018-07-08 DIAGNOSIS — J069 Acute upper respiratory infection, unspecified: Secondary | ICD-10-CM | POA: Diagnosis not present

## 2018-07-08 LAB — CBC WITH DIFFERENTIAL/PLATELET
Abs Immature Granulocytes: 0.01 10*3/uL (ref 0.00–0.07)
Basophils Absolute: 0 10*3/uL (ref 0.0–0.1)
Basophils Relative: 1 %
Eosinophils Absolute: 0.1 10*3/uL (ref 0.0–0.5)
Eosinophils Relative: 2 %
HCT: 35.4 % — ABNORMAL LOW (ref 36.0–46.0)
Hemoglobin: 10.9 g/dL — ABNORMAL LOW (ref 12.0–15.0)
Immature Granulocytes: 0 %
Lymphocytes Relative: 53 %
Lymphs Abs: 1.9 10*3/uL (ref 0.7–4.0)
MCH: 26.1 pg (ref 26.0–34.0)
MCHC: 30.8 g/dL (ref 30.0–36.0)
MCV: 84.9 fL (ref 80.0–100.0)
Monocytes Absolute: 0.5 10*3/uL (ref 0.1–1.0)
Monocytes Relative: 13 %
Neutro Abs: 1.1 10*3/uL — ABNORMAL LOW (ref 1.7–7.7)
Neutrophils Relative %: 31 %
Platelets: 278 10*3/uL (ref 150–400)
RBC: 4.17 MIL/uL (ref 3.87–5.11)
RDW: 14.6 % (ref 11.5–15.5)
WBC: 3.6 10*3/uL — ABNORMAL LOW (ref 4.0–10.5)
nRBC: 0 % (ref 0.0–0.2)

## 2018-07-08 LAB — BASIC METABOLIC PANEL
Anion gap: 7 (ref 5–15)
BUN: 13 mg/dL (ref 6–20)
CO2: 20 mmol/L — ABNORMAL LOW (ref 22–32)
Calcium: 8.1 mg/dL — ABNORMAL LOW (ref 8.9–10.3)
Chloride: 108 mmol/L (ref 98–111)
Creatinine, Ser: 0.44 mg/dL (ref 0.44–1.00)
GFR calc Af Amer: >60 mL/min (ref >60)
GFR calc non Af Amer: >60 mL/min (ref >60)
Glucose, Bld: 98 mg/dL (ref 70–99)
Potassium: 3.5 mmol/L (ref 3.5–5.1)
Sodium: 135 mmol/L (ref 135–145)

## 2018-07-08 LAB — TROPONIN I: Troponin I: <0.03 ng/mL (ref <0.03)

## 2018-07-08 MED ORDER — ONDANSETRON 4 MG PO TBDP: 4.0000 mg | ORAL_TABLET | Freq: Three times a day (TID) | ORAL | 0 refills | Status: DC | PRN

## 2018-07-08 MED ORDER — DIPHENHYDRAMINE HCL 50 MG/ML IJ SOLN
12.5000 mg | Freq: Once | INTRAMUSCULAR | Status: DC
  Filled 2018-07-08: qty 1

## 2018-07-08 MED ORDER — ONDANSETRON HCL 4 MG/2ML IJ SOLN
4.0000 mg | Freq: Once | INTRAMUSCULAR | Status: AC
  Administered 2018-07-08: 4 mg via INTRAVENOUS
  Filled 2018-07-08: qty 2

## 2018-07-08 MED ORDER — SODIUM CHLORIDE 0.9 % IV BOLUS
1000.0000 mL | Freq: Once | INTRAVENOUS | Status: AC
  Administered 2018-07-08: 20:00:00 1000 mL via INTRAVENOUS

## 2018-07-08 MED ORDER — BENZONATATE 100 MG PO CAPS: 100.0000 mg | ORAL_CAPSULE | Freq: Three times a day (TID) | ORAL | 0 refills | Status: DC

## 2018-07-08 MED ORDER — ACETAMINOPHEN 325 MG PO TABS
650.0000 mg | ORAL_TABLET | Freq: Once | ORAL | Status: AC
  Administered 2018-07-08: 20:00:00 650 mg via ORAL
  Filled 2018-07-08: qty 2

## 2018-07-08 MED ORDER — METOCLOPRAMIDE HCL 5 MG/ML IJ SOLN
10.0000 mg | Freq: Once | INTRAMUSCULAR | Status: AC
  Administered 2018-07-08: 10 mg via INTRAVENOUS
  Filled 2018-07-08: qty 2

## 2018-07-08 NOTE — Discharge Instructions (Addendum)
You have been diagnosed today with viral upper respiratory tract infection with cough and headache.  At this time there does not appear to be the presence of an emergent medical condition, however there is always the potential for conditions to change. Please read and follow the below instructions.  Please return to the Emergency Department immediately for any new or worsening symptoms. Please be sure to follow up with your Primary Care Provider within one week regarding your visit today; please call their office to schedule an appointment even if you are feeling better for a follow-up visit. You may use the medication Tessalon as prescribed to help with your cough.  You may use the medication Zofran as prescribed to help with nausea and vomiting. As we discussed per current guidelines testing for the COVID-19 viruses not indicated at this time.  I advised that you still self quarantine for the next 2 weeks and be symptom-free for at least 3 days prior to breaking her quarantine.  Please drink plenty of water and get plenty of rest to help with your symptoms.  You may also use over-the-counter Tylenol as directed on the packaging to help with your symptoms.  Patient to follow-up with your primary care provider within 1 week for a recheck and return to the emergency department immediately for any new or worsening symptoms.  Get help right away if: You feel pain or pressure in your chest. You have shortness of breath. You faint or feel like you will faint. You keep throwing up (vomiting). You feel confused. Get help right away if: Your headache becomes severe quickly. Your headache gets worse after moderate to intense physical activity. You have repeated vomiting. You have a stiff neck. You have a loss of vision. You have problems with speech. You have pain in the eye or ear. You have muscular weakness or loss of muscle control. You lose your balance or have trouble walking. You feel faint or  pass out. You have confusion. You have a seizure. You headache is different than your normal headache. Any new or concerning symptoms  Please read the additional information packets attached to your discharge summary.  Do not take your medicine if  develop an itchy rash, swelling in your mouth or lips, or difficulty breathing.  ================  If you live with, or provide care at home for, a person confirmed to have, or being evaluated for, COVID-19 infection please follow these guidelines to prevent infection:  Follow healthcare providers instructions Make sure that you understand and can help the patient follow any healthcare provider instructions for all care.  Provide for the patients basic needs You should help the patient with basic needs in the home and provide support for getting groceries, prescriptions, and other personal needs.  Monitor the patients symptoms If they are getting sicker, call his or her medical provider a  This will help the healthcare providers office take steps to keep other people from getting infected. Ask the healthcare provider to call the local or state health department.  Limit the number of people who have contact with the patient If possible, have only one caregiver for the patient. Other household members should stay in another home or place of residence. If this is not possible, they should stay in another room, or be separated from the patient as much as possible. Use a separate bathroom, if available. Restrict visitors who do not have an essential need to be in the home.  Keep older adults, very young children,  and other sick people away from the patient Keep older adults, very young children, and those who have compromised immune systems or chronic health conditions away from the patient. This includes people with chronic heart, lung, or kidney conditions, diabetes, and cancer.  Ensure good ventilation Make sure that shared spaces in  the home have good air flow, such as from an air conditioner or an opened window, weather permitting.  Wash your hands often Wash your hands often and thoroughly with soap and water for at least 20 seconds. You can use an alcohol based hand sanitizer if soap and water are not available and if your hands are not visibly dirty. Avoid touching your eyes, nose, and mouth with unwashed hands. Use disposable paper towels to dry your hands. If not available, use dedicated cloth towels and replace them when they become wet.  Wear a facemask and gloves Wear a disposable facemask at all times in the room and gloves when you touch or have contact with the patients blood, body fluids, and/or secretions or excretions, such as sweat, saliva, sputum, nasal mucus, vomit, urine, or feces.  Ensure the mask fits over your nose and mouth tightly, and do not touch it during use. Throw out disposable facemasks and gloves after using them. Do not reuse. Wash your hands immediately after removing your facemask and gloves. If your personal clothing becomes contaminated, carefully remove clothing and launder. Wash your hands after handling contaminated clothing. Place all used disposable facemasks, gloves, and other waste in a lined container before disposing them with other household waste. Remove gloves and wash your hands immediately after handling these items.  Do not share dishes, glasses, or other household items with the patient Avoid sharing household items. You should not share dishes, drinking glasses, cups, eating utensils, towels, bedding, or other items After the person uses these items, you should wash them thoroughly with soap and water.  Wash laundry thoroughly Immediately remove and wash clothes or bedding that have blood, body fluids, and/or secretions or excretions, such as sweat, saliva, sputum, nasal mucus, vomit, urine, or feces, on them. Wear gloves when handling laundry from the patient. Read  and follow directions on labels of laundry or clothing items and detergent. In general, wash and dry with the warmest temperatures recommended on the label.  Clean all areas the individual has used often Clean all touchable surfaces, such as counters, tabletops, doorknobs, bathroom fixtures, toilets, phones, keyboards, tablets, and bedside tables, every day. Also, clean any surfaces that may have blood, body fluids, and/or secretions or excretions on them. Wear gloves when cleaning surfaces the patient has come in contact with. Use a diluted bleach solution (e.g., dilute bleach with 1 part bleach and 10 parts water) or a household disinfectant with a label that says EPA-registered for coronaviruses. To make a bleach solution at home, add 1 tablespoon of bleach to 1 quart (4 cups) of water. For a larger supply, add  cup of bleach to 1 gallon (16 cups) of water. Read labels of cleaning products and follow recommendations provided on product labels. Labels contain instructions for safe and effective use of the cleaning product including precautions you should take when applying the product, such as wearing gloves or eye protection and making sure you have good ventilation during use of the product. Remove gloves and wash hands immediately after cleaning.  Monitor yourself for signs and symptoms of illness Caregivers and household members are considered close contacts, should monitor their health, and will be asked  to limit movement outside of the home to the extent possible. Follow the monitoring steps for close contacts listed on the symptom monitoring form.   ? If you have additional questions, contact your local health department or call the epidemiologist on call at [phone] (available 24/7). ? This guidance is subject to change. For the most up-to-date guidance from [employer], please refer to their website: [email or web address]

## 2018-07-08 NOTE — ED Provider Notes (Signed)
[facility] EMERGENCY DEPARTMENT Provider Note   CSN: [id number] Arrival date & time: 07/08/18  1817    History   Chief Complaint Chief Complaint  Patient presents with   Cough   Headache    HPI Marissa Lin is a 48 y.o. female presenting today for multiple complaints.  Patient's primary complaint is headache x4 days.  Patient states that she has a history of migraines and her headache today is similar to her past headaches for which she normally uses Zyrtec to treat. Patient reports that she lost her Zyrtec which is led to her prolonged migraine.  She denies abnormal features of this migraine.  She describes a frontal sinus pressure constant without aggravating factors.  She denies any radiation of this pain and describes it as mild in intensity.  Additionally patient is concerned for COVID-19.  Patient reports that her sister was recently diagnosed with COVID-19 last week.  Patient reports that she has been having a mild cough productive with clear white sputum.  Patient reports that she occasionally feels that her chest is congested and this is improved following a strong cough.  She denies any chest pain or shortness of breath.  She denies any fever.  Patient has been attempting to self quarantine for the past 7 days and has not been in physical contact with her sister for 2 weeks.  Patient denies any additional concerns today.  Of note patient denies history of blood clot, exogenous hormone use, extremity swelling, hemoptysis, recent surgery/immobilization, trauma, history of cancer.    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      Home Medications    Prior to Admission medications   Medication Sig Start Date End Date Taking? Authorizing Provider  montelukast (SINGULAIR) 10 MG tablet Take 10 mg by mouth at bedtime.   Yes [provider]  benzonatate (TESSALON) 100 MG capsule Take 1 capsule (100 mg total) by mouth every 8 (eight) hours. 07/08/18   Harlene SaltsMorelli, Leann Mayweather A, PA-C  cetirizine (ZYRTEC) 10 MG tablet Take 1 tablet (10 mg total) by mouth daily. 02/25/17   Maczis, Elmer SowMichael M, PA-C  diclofenac (VOLTAREN) 75 MG EC tablet Take 1 tablet (75 mg total) by mouth 2 (two) times daily. 02/15/18   Hudnall, Azucena FallenShane R, MD  fluticasone (FLONASE) 50 MCG/ACT nasal spray Place 2 sprays into both nostrils daily. 02/25/17   Maczis, Elmer SowMichael M, PA-C  ibuprofen (ADVIL,MOTRIN) 600 MG tablet Take 1 tablet (600 mg total) by mouth every 6 (six) hours as needed. 05/17/17   Law, Waylan BogaAlexandra M, PA-C  ondansetron (ZOFRAN ODT) 4 MG disintegrating tablet Take 1 tablet (4 mg total) by mouth every 8 (eight) hours as needed for nausea or vomiting. 07/08/18   Bill SalinasMorelli, Melburn Treiber A, PA-C    Family History No family history on file.  Social History Social History   Tobacco Use   Smoking status: Never Smoker   Smokeless  tobacco: Never Used  Substance Use Topics   Alcohol use: No    Alcohol/week: 0.0 standard drinks   Drug use: No     Allergies   Nitrofurantoin; Other; and Diflucan [fluconazole]   Review of Systems Review of Systems  Constitutional: Negative.  Negative for chills and fever.  HENT: Negative.  Negative for congestion, rhinorrhea, sore throat, trouble swallowing and voice change.   Eyes: Negative.  Negative for visual disturbance.  Respiratory: Positive for cough (Occasionally productive with clear sputum) and chest tightness (Improved with cough). Negative for  shortness of breath and wheezing.   Cardiovascular: Negative.  Negative for chest pain and leg swelling.  Gastrointestinal: Negative.  Negative for abdominal pain, diarrhea, nausea and vomiting.  Musculoskeletal: Negative.  Negative for arthralgias, back pain, myalgias and neck pain.  Neurological: Positive for headaches (Consistent with her typical migraine). Negative for dizziness, syncope, weakness, light-headedness and numbness.  All other systems reviewed and are negative.  Physical Exam Updated Vital Signs BP (!) 142/87    Pulse 68    Temp 98.1 F (36.7 C) (Oral)    Resp 20    Ht 5' (1.524 m)    Wt 105.2 kg    SpO2 97%    BMI 45.31 kg/m   Physical Exam Constitutional:      General: She is not in acute distress.    Appearance: Normal appearance. She is well-developed. She is not ill-appearing or diaphoretic.  HENT:     Head: Normocephalic and atraumatic. No raccoon eyes, Battle's sign, abrasion or contusion.     Jaw: There is normal jaw occlusion. No trismus.     Right Ear: Tympanic membrane, ear canal and external ear normal. No hemotympanum.     Left Ear: Tympanic membrane, ear canal and external ear normal. No hemotympanum.     Ears:     Comments: Hearing grossly intact bilaterally    Nose: Nose normal. No rhinorrhea.     Right Nostril: No epistaxis.     Left Nostril: No epistaxis.     Mouth/Throat:     Lips: Pink.     Mouth: Mucous membranes are moist.     Pharynx: Oropharynx is clear. Uvula midline.  Eyes:     General: Vision grossly intact. Gaze aligned appropriately.     Extraocular Movements: Extraocular movements intact.     Conjunctiva/sclera: Conjunctivae normal.     Pupils: Pupils are equal, round, and reactive to light.     Comments: Visual fields grossly intact bilaterally  Neck:     Musculoskeletal: Full passive range of motion without pain, normal range of motion and neck supple. No neck rigidity.     Trachea: Trachea and phonation normal. No tracheal  tenderness or tracheal deviation.     Meningeal: Brudzinski's sign and Kernig's sign absent.  Cardiovascular:     Rate and Rhythm: Normal rate and regular rhythm.     Pulses:          Dorsalis pedis pulses are 2+ on the right side and 2+ on the left side.       Posterior tibial pulses are 2+ on the right side and 2+ on the left side.     Heart sounds: Normal heart sounds.  Pulmonary:     Effort: Pulmonary effort is normal. No accessory muscle usage or respiratory distress.     Breath sounds: Normal breath sounds and air entry. No wheezing or rhonchi.  Chest:     Chest wall: No deformity, tenderness or  crepitus.  Abdominal:     General: Bowel sounds are normal. There is no distension.     Palpations: Abdomen is soft.     Tenderness: There is no abdominal tenderness. There is no guarding or rebound.  Musculoskeletal:     Right lower leg: Normal. She exhibits no tenderness. No edema.     Left lower leg: Normal. She exhibits no tenderness. No edema.     Comments: No midline C/T/L spinal tenderness to palpation, no paraspinal muscle tenderness, no deformity, crepitus, or step-off noted. No sign of injury to the neck or back.  Feet:     Right foot:     Protective Sensation: 3 sites tested. 3 sites sensed.     Left foot:     Protective Sensation: 3 sites tested. 3 sites sensed.  Skin:    General: Skin is warm and dry.  Neurological:     Mental Status: She is alert and oriented to person, place, and time.     GCS: GCS eye subscore is 4. GCS verbal subscore is 5. GCS motor subscore is 6.     Comments: Mental Status: Alert, oriented, thought content appropriate, able to give a coherent history. Speech fluent without evidence of aphasia. Able to follow 2 step commands without difficulty. Cranial Nerves: II: Peripheral visual fields grossly normal, pupils equal, round, reactive to light III,IV, VI: ptosis not present, extra-ocular motions intact bilaterally V,VII: smile symmetric,  eyebrows raise symmetric, facial light touch sensation equal VIII: hearing grossly normal to voice X: uvula elevates symmetrically XI: bilateral shoulder shrug symmetric and strong XII: midline tongue extension without fassiculations Motor: Normal tone. 5/5 strength in upper and lower extremities bilaterally including strong and equal grip strength and dorsiflexion/plantar flexion Sensory: Sensation intact to light touch in all extremities.Negative Romberg.  Deep Tendon Reflexes: 2+ and symmetric in the biceps and patella Cerebellar: normal finger-to-nose maze with bilateral upper extremities. Normal heel-to -shin balance bilaterally of the lower extremity. No pronator drift.  Gait: normal gait and balance CV: distal pulses palpable throughout  Psychiatric:        Mood and Affect: Mood normal.        Behavior: Behavior is cooperative.      ED Treatments / Results  Labs (all labs ordered are listed, but only abnormal results are displayed) Labs Reviewed  CBC WITH DIFFERENTIAL/PLATELET - Abnormal; Notable for the following components:      Result Value   WBC 3.6 (*)    Hemoglobin 10.9 (*)    HCT 35.4 (*)    Neutro Abs 1.1 (*)    All other components within normal limits  BASIC METABOLIC PANEL - Abnormal; Notable for the following components:   CO2 20 (*)    Calcium 8.1 (*)    All other components within normal limits  TROPONIN I    EKG EKG Interpretation  Date/Time:  Thursday July 08 2018 20:31:42 EDT Ventricular Rate:  81 PR Interval:    QRS Duration: 101 QT Interval:  388 QTC Calculation: 451 R Axis:   35 Text Interpretation:  Sinus rhythm Confirmed by Blane Ohara [phone]) on 07/08/2018 9:50:41 PM   Radiology Dg Chest Portable 1 View  Result Date: 07/08/2018 CLINICAL DATA:  48 year old female with cough. EXAM: PORTABLE CHEST 1 VIEW COMPARISON:  Chest radiograph dated 04/11/2015 FINDINGS: The heart size and mediastinal contours are within normal limits. Both  lungs are clear. The visualized skeletal structures are unremarkable. IMPRESSION: No active disease. Electronically Signed   By: Burtis Junes  Radparvar M.D.   On: 07/08/2018 19:48    Procedures Procedures (including critical care time)  Medications Ordered in ED Medications  diphenhydrAMINE (BENADRYL) injection 12.5 mg (12.5 mg Intravenous Not Given 07/08/18 2025)  acetaminophen (TYLENOL) tablet 650 mg (650 mg Oral Given 07/08/18 2022)  sodium chloride 0.9 % bolus 1,000 mL (0 mLs Intravenous Stopped 07/08/18 2045)  metoCLOPramide (REGLAN) injection 10 mg (10 mg Intravenous Given 07/08/18 2015)  ondansetron (ZOFRAN) injection 4 mg (4 mg Intravenous Given 07/08/18 2021)     Initial Impression / Assessment and Plan / ED Course  I have reviewed the triage vital signs and the nursing notes.  Pertinent labs & imaging results that were available during my care of the patient were reviewed by me and considered in my medical decision making (see chart for details).    48 year old female with known positive COVID-19 contact presenting today for headache x4 days, cough and chest congestion x1 week.  Patient denies any chest pain or shortness of breath.  She is overall very well-appearing on arrival and in no acute distress.  Heart regular rate and rhythm without murmur, lungs clear to auscultation bilaterally.  She is low risk by Wells criteria and PERC negative.  Troponin negative CBC unremarkable BMP unremarkable EKG without acute findings reviewed with Dr. Jodi Mourning Chest x-ray: IMPRESSION:  No active disease.   Suspect patient's chest congestion and cough secondary to viral URI.  Do not suspect ACS, dissection, PE or other acute cardiopulmonary etiologies of her symptoms.  She is afebrile, not tachycardic and not hypoxic on room air.  She is without chest pain or shortness of breath and additionally her chest congestion is improved with her cough.  This patient was evaluated in the context of the global  COVID-19 pandemic, which necessitated consideration that the patient might be at risk for infection with the SARS-CoV-2 virus that causes COVID-19. Institutional protocols and algorithms that pertain to the evaluation of patients at risk for COVID-19 are in a state of rapid change based on information released by regulatory bodies including the CDC and federal and state organizations. These policies and algorithms were followed during the patient's care in the ED. despite her known positive COVID-19 contact she has no fever or shortness of breath and does not meet criteria for admission so testing for novel coronavirus is not indicated per current guidelines.  I have discussed increasing water intake, OTC Tylenol and rest with the patient.  I have advised her to self quarantine for an additional 2 weeks and follow-up with her PCP.  Discussed strict return precautions with patient including shortness of breath. --------------------- Additionally patient with complaint of 4 days of migraine headache.  She states that is consistent with her typical migraine and is without abnormal features.  Physical examination today is reassuring and without focal neuro deficit.  There are no red flags regarding her headache today, there are no meningeal signs, no pain around the temporal arteries, no history of trauma, syncope or sudden onset.  Patient is well-appearing.  Her pain is centered around her sinuses and there is no signs of sinusitis today.  Imaging is not indicated at this time.  Patient was treated with IV fluids, Tylenol, Reglan, she refused Benadryl.  Patient reassessed and states complete resolution of her headache.  She is now requesting discharge. The patient denies any neurologic symptoms such as visual changes, focal numbness/weakness, balance problems, confusion, or speech difficulty to suggest a life-threatening intracranial process such as intracranial hemorrhage or mass.  The patient has no clotting risk  factors thus venous sinus thrombosis is unlikely. No fevers, neck pain or nuchal rigidity to suggest meningitis. Patient is afebrile, non-toxic and well appearing. Reassuring neuro exam, normal gait around room and back. No cranial deficits, no speech deficits, negative pronator drift, normal/equal strength to all extremities.   PCP follow up strongly encouraged. I have reviewed return precautions including development of fever, nausea/vomiting or neurologic symptoms, vision changes, confusion, lethargy, difficulty speaking/walking, or other new/worsening/concerning symptoms. Patient states understanding of return precautions.   At this time there does not appear to be any evidence of an acute emergency medical condition and the patient appears stable for discharge with appropriate outpatient follow up. Diagnosis was discussed with patient who verbalizes understanding of care plan and is agreeable to discharge. I have discussed return precautions with patient who verbalizes understanding of return precautions. Patient encouraged to follow-up with their PCP. All questions answered. Patient has been discharged in good condition.  Note: Portions of this report may have been transcribed using voice recognition software. Every effort was made to ensure accuracy; however, inadvertent computerized transcription errors may still be present. Final Clinical Impressions(s) / ED Diagnoses   Final diagnoses:  Viral URI with cough  Nonintractable headache, unspecified chronicity pattern, unspecified headache type    ED Discharge Orders         Ordered    ondansetron (ZOFRAN ODT) 4 MG disintegrating tablet  Every 8 hours PRN     07/08/18 2241    benzonatate (TESSALON) 100 MG capsule  Every 8 hours     07/08/18 2245           Elizabeth Palau 07/08/18 2328    Blane Ohara, MD 07/08/18 [phone]

## 2018-07-08 NOTE — ED Triage Notes (Signed)
Headache and chest congestion with cough that causes her chest to be tight at times. She has been exposed to a positive Covid19 patient.

## 2018-07-12 ENCOUNTER — Other Ambulatory Visit (HOSPITAL_COMMUNITY): Payer: Managed Care, Other (non HMO)

## 2018-07-12 ENCOUNTER — Encounter (HOSPITAL_COMMUNITY): Payer: Self-pay

## 2018-07-12 ENCOUNTER — Ambulatory Visit (HOSPITAL_COMMUNITY): Payer: Managed Care, Other (non HMO)

## 2018-07-26 ENCOUNTER — Ambulatory Visit: Payer: Managed Care, Other (non HMO) | Admitting: Dietician

## 2018-08-03 ENCOUNTER — Ambulatory Visit: Payer: Managed Care, Other (non HMO) | Admitting: Skilled Nursing Facility1

## 2018-08-09 ENCOUNTER — Ambulatory Visit (HOSPITAL_COMMUNITY): Payer: Managed Care, Other (non HMO)

## 2018-08-11 ENCOUNTER — Encounter: Payer: Self-pay | Admitting: Dietician

## 2018-08-11 ENCOUNTER — Encounter: Payer: Managed Care, Other (non HMO) | Attending: General Surgery | Admitting: Dietician

## 2018-08-11 ENCOUNTER — Other Ambulatory Visit: Payer: Self-pay

## 2018-08-11 VITALS — Ht 60.0 in | Wt 235.3 lb

## 2018-08-11 DIAGNOSIS — Z90710 Acquired absence of both cervix and uterus: Secondary | ICD-10-CM | POA: Insufficient documentation

## 2018-08-11 DIAGNOSIS — Z6841 Body Mass Index (BMI) 40.0 and over, adult: Secondary | ICD-10-CM | POA: Insufficient documentation

## 2018-08-11 DIAGNOSIS — E669 Obesity, unspecified: Secondary | ICD-10-CM

## 2018-08-11 DIAGNOSIS — Z713 Dietary counseling and surveillance: Secondary | ICD-10-CM | POA: Insufficient documentation

## 2018-08-11 NOTE — Patient Instructions (Signed)
Begin working through the [employer] discussed today and start with 1 or 2 you think may take some time to work through. A lot of these you have already accomplished!!  We will see you at Pre-Op Class 2 weeks before your surgery date. Please contact us in the meantime with any questions or concerns!

## 2018-08-11 NOTE — Progress Notes (Signed)
Bariatric Pre-Op Nutrition Assessment Medical Nutrition Therapy  Appt Start Time: 4:15pm  End time: 5:15pm   Patient was seen on 08/11/2018 for Pre-Operative Nutrition Assessment. Assessment and letter of approval faxed to [facility] Bariatric Surgery Program coordinator on 08/11/2018.   Planned surgery: Sleeve Gastrectomy  Pt expectation of surgery: to lose weight, feel better, relieve knee pain, avoid health problems (especially family hx of blood clots and diabetes)   Pt expectation of dietitian: none stated   Anthropometrics  Start weight at [facility]: 235.3 lbs (date: 08/11/2018) Height: 60 in BMI: 45.95 kg/m2    Clinical  Medical Hx: obesity, osteoarthritis, migraines  Surgeries: oophorectomy, hysterectomy  Medications: N/A Allergies: analgesics   Psychosocial/Lifestyle Pt is very kind, personable, and talkative. She lives with her husband. 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 Pt does not smoke or drink alcohol b/c of migraines. States she hopes to have surgery around October because that is when she will be able to take vacation time from work. Finances are somewhat of an issue, but her husband's health insurance helps cover this process. Pt states she understands there is a mental piece to being successful after surgery and believes it takes a lot of willpower to make changes. She states she feels uncomfortable and is upset with her weight, but is excited to see changes that come with having surgery.   24-Hr Dietary Recall First Meal: skips  Snack: peanut butter crackers Second Meal: burger + fries  Snack: chips Third Meal: pizza  Snack: usually none  Beverages: water, soda, coffee + 10 Splenda packets  Food & Nutrition Related Hx Dietary Hx: Eats out a lot. Bakes (instead of fries) meat, or will cook ground beef/turkey. Will drink 2% when has milk. Likes vegetables,  chicken, fish. Avoids pork dt headaches. Doesn't eat a lot of sweets. Skips breakfast.  Estimated Daily Fluid Intake: 1 bottle water Supplements: ACV gummies  GI / Other Notable Symptoms: nauseous (mornings, going to see GI doctor); constipation (depending on what eats)   Physical Activity  Current average weekly physical activity: walking 2-3 days/week   Estimated Energy Needs Calories: 1600 Carbohydrate: 180g Protein: 120g Fat: 44g  Pre-Op Goals Reviewed with the Patient . Track food and beverage intake (try MyFitness Pal or the Baritastic app) . Make healthy food choices while monitoring portion sizes . Avoid concentrated sugars and fried foods . Keep fat & sugar in the single digits per serving on food labels . Practice CHEWING your food (aim for applesauce consistency) . Practice not drinking 15 minutes before, during, and 30 minutes after each meal and snack . Avoid all carbonated beverages (ex: soda, sparkling beverages)  . Limit caffeinated beverages (ex: coffee, tea, energy drinks) . Avoid all sugar-sweetened beverages (ex: regular soda, sports drinks)  . Avoid alcohol  . Consume 3 meals per day or try to eat every 3-5 hours . Make a list of non-food related activities . Aim for 64-100 ounces of FLUID daily (with at least half of fluid intake being plain water)  . Aim for at least 60-80 grams of PROTEIN daily . Look for a liquid protein source that contains ?15 g protein and ?5 g carbohydrate (ex: shakes, drinks, shots) . Physical activity is an important part of a healthy lifestyle so keep it moving! The goal is to reach 150 minutes of exercise per week, including cardiovascular and weight baring activity.  Handouts Provided Include  . Bariatric Surgery handouts (Nutrition Visits, Pre-Op Goals, Protein Shakes, Vitamins & Minerals)  Learning Style & Readiness for Change Teaching method utilized: Visual & Auditory  Demonstrated degree of understanding via: Teach Back   Barriers to learning/adherence to lifestyle change: None Identified (finances may be limited)    Next Steps Supervised Weight Loss (SWL) Visits Needed: 0  Patient is to call [facility] to enroll in Pre-Op Class (>2 weeks before surgery) and Post-Op Class (2 weeks after surgery) for further nutrition education when surgery date is scheduled.

## 2018-09-20 ENCOUNTER — Ambulatory Visit (HOSPITAL_COMMUNITY): Admission: RE | Admit: 2018-09-20 | Payer: Managed Care, Other (non HMO) | Source: Ambulatory Visit

## 2018-09-20 ENCOUNTER — Ambulatory Visit (HOSPITAL_COMMUNITY): Payer: Managed Care, Other (non HMO)

## 2018-10-04 ENCOUNTER — Other Ambulatory Visit (HOSPITAL_COMMUNITY): Payer: Managed Care, Other (non HMO)

## 2019-02-23 DIAGNOSIS — Z8249 Family history of ischemic heart disease and other diseases of the circulatory system: Secondary | ICD-10-CM | POA: Insufficient documentation

## 2019-02-23 DIAGNOSIS — J302 Other seasonal allergic rhinitis: Secondary | ICD-10-CM | POA: Insufficient documentation

## 2019-02-23 HISTORY — DX: Family history of ischemic heart disease and other diseases of the circulatory system: Z82.49

## 2019-05-10 ENCOUNTER — Other Ambulatory Visit: Payer: Managed Care, Other (non HMO)

## 2019-08-04 ENCOUNTER — Other Ambulatory Visit: Payer: Self-pay

## 2019-08-04 ENCOUNTER — Encounter (HOSPITAL_BASED_OUTPATIENT_CLINIC_OR_DEPARTMENT_OTHER): Payer: Self-pay | Admitting: Emergency Medicine

## 2019-08-04 ENCOUNTER — Emergency Department (HOSPITAL_BASED_OUTPATIENT_CLINIC_OR_DEPARTMENT_OTHER)
Admission: EM | Admit: 2019-08-04 | Discharge: 2019-08-04 | Disposition: A | Discharge status: Home / Self Care | Payer: Managed Care, Other (non HMO) | Attending: Emergency Medicine | Admitting: Emergency Medicine

## 2019-08-04 DIAGNOSIS — R519 Headache, unspecified: Secondary | ICD-10-CM | POA: Insufficient documentation

## 2019-08-04 DIAGNOSIS — M545 Low back pain, unspecified: Secondary | ICD-10-CM

## 2019-08-04 DIAGNOSIS — Z79899 Other long term (current) drug therapy: Secondary | ICD-10-CM | POA: Insufficient documentation

## 2019-08-04 LAB — URINALYSIS, ROUTINE W REFLEX MICROSCOPIC
Bilirubin Urine: NEGATIVE
Glucose, UA: NEGATIVE mg/dL
Hgb urine dipstick: NEGATIVE
Ketones, ur: NEGATIVE mg/dL
Leukocytes,Ua: NEGATIVE
Nitrite: NEGATIVE
Protein, ur: NEGATIVE mg/dL
Specific Gravity, Urine: 1.02 (ref 1.005–1.030)
pH: 5.5 (ref 5.0–8.0)

## 2019-08-04 MED ORDER — KETOROLAC TROMETHAMINE 15 MG/ML IJ SOLN
15.0000 mg | Freq: Once | INTRAMUSCULAR | Status: AC
  Administered 2019-08-04: 15 mg via INTRAVENOUS
  Filled 2019-08-04: qty 1

## 2019-08-04 MED ORDER — SODIUM CHLORIDE 0.9 % IV BOLUS
1000.0000 mL | Freq: Once | INTRAVENOUS | Status: AC
  Administered 2019-08-04: 05:00:00 1000 mL via INTRAVENOUS

## 2019-08-04 MED ORDER — PANTOPRAZOLE SODIUM 40 MG IV SOLR
40.0000 mg | Freq: Once | INTRAVENOUS | Status: AC
  Administered 2019-08-04: 05:00:00 40 mg via INTRAVENOUS
  Filled 2019-08-04: qty 40

## 2019-08-04 MED ORDER — MELOXICAM 15 MG PO TABS: ORAL_TABLET | ORAL | 0 refills | Status: DC

## 2019-08-04 MED ORDER — DIPHENHYDRAMINE HCL 50 MG/ML IJ SOLN
25.0000 mg | Freq: Once | INTRAMUSCULAR | Status: AC
  Administered 2019-08-04: 25 mg via INTRAVENOUS
  Filled 2019-08-04: qty 1

## 2019-08-04 MED ORDER — METOCLOPRAMIDE HCL 5 MG/ML IJ SOLN
10.0000 mg | Freq: Once | INTRAMUSCULAR | Status: AC
  Administered 2019-08-04: 10 mg via INTRAVENOUS
  Filled 2019-08-04: qty 2

## 2019-08-04 NOTE — ED Triage Notes (Signed)
Patient presents with complaints of back pain onset yesterday, pain "behind her eyes" onset 4 days ago, and heartburn onset this evening. States recently started flagyl for vaginal discharge and is concerned that this could be in relation on the back pain. Denies urinary sx. States vaginal irritation has improved.

## 2019-08-04 NOTE — ED Provider Notes (Signed)
[place] DEPT [facility] Provider Note: Georgena Spurling, MD, FACEP  CSN: [id number] MRN: [id number] ARRIVAL: 08/04/19 at [place]: [place]  Back Pain   HISTORY OF PRESENT ILLNESS  08/04/19 4:17 AM Marissa Lin is a 49 y.o. female who complains of a 3-day history of headache.  The headache is frontal and "behind her eyes".  Pain is moderate in severity.  It has not been relieved by over-the-counter analgesics.  She also began having low back pain yesterday.  It is not significantly worse with movement and she has not noticed dysuria.  She was recently treated with Flagyl for bacterial vaginosis.   Past Medical History:  Diagnosis Date  . Arthritis   . Carpal tunnel syndrome   . Metatarsal deformity   . Migraine   . Plantar fasciitis     Past Surgical History:  Procedure Laterality Date  . ABDOMINAL HYSTERECTOMY    . CESAREAN SECTION      No family history on file.  Social History   Tobacco Use  . Smoking status: Never Smoker  . Smokeless tobacco: Never Used  Substance Use Topics  . Alcohol use: No    Alcohol/week: 0.0 standard drinks  . Drug use: No    Prior to Admission medications   Medication Sig Start Date End Date Taking? Authorizing Provider  benzonatate (TESSALON) 100 MG capsule Take 1 capsule (100 mg total) by mouth every 8 (eight) hours. 07/08/18   Nuala Alpha A, PA-C  cetirizine (ZYRTEC) 10 MG tablet Take 1 tablet (10 mg total) by mouth daily. 02/25/17   Maczis, Barth Kirks, PA-C  diclofenac (VOLTAREN) 75 MG EC tablet Take 1 tablet (75 mg total) by mouth 2 (two) times daily. 02/15/18   Hudnall, Sharyn Lull, MD  fluticasone (FLONASE) 50 MCG/ACT nasal spray Place 2 sprays into both nostrils daily. 02/25/17   Maczis, Barth Kirks, PA-C  ibuprofen (ADVIL,MOTRIN) 600 MG tablet Take 1 tablet (600 mg total) by mouth every 6 (six) hours as needed. 05/17/17   Law, Bea Graff, PA-C  montelukast (SINGULAIR) 10 MG tablet Take 10 mg by mouth  at bedtime.    [provider]  ondansetron (ZOFRAN ODT) 4 MG disintegrating tablet Take 1 tablet (4 mg total) by mouth every 8 (eight) hours as needed for nausea or vomiting. 07/08/18   Nuala Alpha A, PA-C    Allergies Nitrofurantoin, Other, and Diflucan [fluconazole]   REVIEW OF SYSTEMS  Negative except as noted here or in the History of Present Illness.   PHYSICAL EXAMINATION  Initial Vital Signs Blood pressure 106/84, pulse 81, temperature 98.2 F (36.8 C), temperature source Oral, resp. rate 18, height 5' (1.524 m), weight 106 kg, SpO2 100 %.  Examination General: Well-developed, well-nourished female in no acute distress; appearance consistent with age of record HENT: normocephalic; atraumatic Eyes: pupils equal, round and reactive to light; extraocular muscles intact; globes soft and nontender; mild photophobia Neck: supple Heart: regular rate and rhythm Lungs: clear to auscultation bilaterally Abdomen: soft; nondistended; nontender; bowel sounds present Back: Nontender; negative straight leg raise bilaterally Extremities: No deformity; full range of motion; pulses normal Neurologic: Awake, alert and oriented; motor function intact in all extremities and symmetric; no facial droop Skin: Warm and dry Psychiatric: Normal mood and affect   RESULTS  Summary of this visit's results, reviewed and interpreted by myself:   EKG Interpretation  Date/Time:    Ventricular Rate:    PR Interval:    QRS Duration:   QT  Interval:    QTC Calculation:   R Axis:     Text Interpretation:        Laboratory Studies: Results for orders placed or performed during the hospital encounter of 08/04/19 (from the past 24 hour(s))  Urinalysis, Routine w reflex microscopic     Status: None   Collection Time: 08/04/19  3:50 AM  Result Value Ref Range   Color, Urine YELLOW YELLOW   APPearance CLEAR CLEAR   Specific Gravity, Urine 1.020 1.005 - 1.030   pH 5.5 5.0 - 8.0    Glucose, UA NEGATIVE NEGATIVE mg/dL   Hgb urine dipstick NEGATIVE NEGATIVE   Bilirubin Urine NEGATIVE NEGATIVE   Ketones, ur NEGATIVE NEGATIVE mg/dL   Protein, ur NEGATIVE NEGATIVE mg/dL   Nitrite NEGATIVE NEGATIVE   Leukocytes,Ua NEGATIVE NEGATIVE   Imaging Studies: No results found.  ED COURSE and MDM  Nursing notes, initial and subsequent vitals signs, including pulse oximetry, reviewed and interpreted by myself.  Vitals:   08/04/19 0126 08/04/19 0129  BP:  106/84  Pulse:  81  Resp:  18  Temp:  98.2 F (36.8 C)  TempSrc:  Oral  SpO2:  100%  Weight: 106 kg   Height: 5' (1.524 m)    Medications  sodium chloride 0.9 % bolus 1,000 mL (1,000 mLs Intravenous New Bag/Given 08/04/19 0444)  diphenhydrAMINE (BENADRYL) injection 25 mg (25 mg Intravenous Given 08/04/19 0445)  metoCLOPramide (REGLAN) injection 10 mg (10 mg Intravenous Given 08/04/19 0500)  ketorolac (TORADOL) 15 MG/ML injection 15 mg (15 mg Intravenous Given 08/04/19 0449)  pantoprazole (PROTONIX) injection 40 mg (40 mg Intravenous Given 08/04/19 0452)   6:03 AM Patient feeling better after IV fluids and medications.  No evidence of urinary tract infection.   PROCEDURES  Procedures   ED DIAGNOSES     ICD-10-CM   1. Acute bilateral low back pain without sciatica  M54.5   2. Bad headache  R51.9        Xan Sparkman, Jonny Ruiz, MD 08/04/19 [phone]

## 2019-11-23 ENCOUNTER — Other Ambulatory Visit: Payer: Self-pay

## 2019-11-23 ENCOUNTER — Emergency Department (HOSPITAL_BASED_OUTPATIENT_CLINIC_OR_DEPARTMENT_OTHER)
Admission: EM | Admit: 2019-11-23 | Discharge: 2019-11-24 | Disposition: A | Discharge status: Home / Self Care | Payer: Managed Care, Other (non HMO) | Attending: Emergency Medicine | Admitting: Emergency Medicine

## 2019-11-23 ENCOUNTER — Encounter (HOSPITAL_BASED_OUTPATIENT_CLINIC_OR_DEPARTMENT_OTHER): Payer: Self-pay

## 2019-11-23 DIAGNOSIS — R1030 Lower abdominal pain, unspecified: Secondary | ICD-10-CM | POA: Diagnosis not present

## 2019-11-23 DIAGNOSIS — R109 Unspecified abdominal pain: Secondary | ICD-10-CM | POA: Diagnosis present

## 2019-11-23 LAB — URINALYSIS, ROUTINE W REFLEX MICROSCOPIC
Bilirubin Urine: NEGATIVE
Glucose, UA: NEGATIVE mg/dL
Hgb urine dipstick: NEGATIVE
Ketones, ur: NEGATIVE mg/dL
Leukocytes,Ua: NEGATIVE
Nitrite: NEGATIVE
Protein, ur: NEGATIVE mg/dL
Specific Gravity, Urine: 1.02 (ref 1.005–1.030)
pH: 7.5 (ref 5.0–8.0)

## 2019-11-23 LAB — LIPASE, BLOOD: Lipase: 25 U/L (ref 11–51)

## 2019-11-23 LAB — COMPREHENSIVE METABOLIC PANEL
ALT: 19 U/L (ref 0–44)
AST: 18 U/L (ref 15–41)
Albumin: 4.1 g/dL (ref 3.5–5.0)
Alkaline Phosphatase: 59 U/L (ref 38–126)
Anion gap: 8 (ref 5–15)
BUN: 12 mg/dL (ref 6–20)
CO2: 23 mmol/L (ref 22–32)
Calcium: 8.6 mg/dL — ABNORMAL LOW (ref 8.9–10.3)
Chloride: 107 mmol/L (ref 98–111)
Creatinine, Ser: 0.57 mg/dL (ref 0.44–1.00)
GFR calc Af Amer: >60 mL/min (ref >60)
GFR calc non Af Amer: >60 mL/min (ref >60)
Glucose, Bld: 109 mg/dL — ABNORMAL HIGH (ref 70–99)
Potassium: 3.8 mmol/L (ref 3.5–5.1)
Sodium: 138 mmol/L (ref 135–145)
Total Bilirubin: 0.4 mg/dL (ref 0.3–1.2)
Total Protein: 7.8 g/dL (ref 6.5–8.1)

## 2019-11-23 LAB — CBC
HCT: 39.4 % (ref 36.0–46.0)
Hemoglobin: 12.5 g/dL (ref 12.0–15.0)
MCH: 26.8 pg (ref 26.0–34.0)
MCHC: 31.7 g/dL (ref 30.0–36.0)
MCV: 84.5 fL (ref 80.0–100.0)
Platelets: 372 10*3/uL (ref 150–400)
RBC: 4.66 MIL/uL (ref 3.87–5.11)
RDW: 15.3 % (ref 11.5–15.5)
WBC: 7.1 10*3/uL (ref 4.0–10.5)
nRBC: 0 % (ref 0.0–0.2)

## 2019-11-23 NOTE — ED Triage Notes (Signed)
Pt c/o left abd pain yesterday-mid abd pain x today-denies n/v/d-NAD-steady gait

## 2019-11-24 ENCOUNTER — Encounter (HOSPITAL_BASED_OUTPATIENT_CLINIC_OR_DEPARTMENT_OTHER): Payer: Self-pay | Admitting: Radiology

## 2019-11-24 ENCOUNTER — Emergency Department (HOSPITAL_BASED_OUTPATIENT_CLINIC_OR_DEPARTMENT_OTHER): Payer: Managed Care, Other (non HMO)

## 2019-11-24 MED ORDER — FENTANYL CITRATE (PF) 100 MCG/2ML IJ SOLN
INTRAMUSCULAR | Status: AC
  Administered 2019-11-24: 100 ug via INTRAVENOUS
  Filled 2019-11-24: qty 2

## 2019-11-24 MED ORDER — ONDANSETRON HCL 4 MG/2ML IJ SOLN: 4.0000 mg | Freq: Once | INTRAMUSCULAR | Status: AC

## 2019-11-24 MED ORDER — ONDANSETRON 8 MG PO TBDP: 8.0000 mg | ORAL_TABLET | Freq: Three times a day (TID) | ORAL | 0 refills | Status: DC | PRN

## 2019-11-24 MED ORDER — FENTANYL CITRATE (PF) 100 MCG/2ML IJ SOLN: 100.0000 ug | Freq: Once | INTRAMUSCULAR | Status: AC

## 2019-11-24 MED ORDER — ONDANSETRON HCL 4 MG/2ML IJ SOLN
INTRAMUSCULAR | Status: AC
  Administered 2019-11-24: 4 mg via INTRAVENOUS
  Filled 2019-11-24: qty 2

## 2019-11-24 MED ORDER — IOHEXOL 300 MG/ML  SOLN
100.0000 mL | Freq: Once | INTRAMUSCULAR | Status: AC | PRN
  Administered 2019-11-24: 100 mL via INTRAVENOUS

## 2019-11-24 MED ORDER — DICYCLOMINE HCL 20 MG PO TABS: 20.0000 mg | ORAL_TABLET | Freq: Four times a day (QID) | ORAL | 0 refills | Status: DC | PRN

## 2019-11-24 NOTE — ED Notes (Signed)
Computer downtime from 0130 until now.  

## 2019-11-24 NOTE — ED Provider Notes (Signed)
[facility] Provider Note: Marissa Dell, MD, FACEP  CSN: [id number] MRN: [id number] ARRIVAL: 11/23/19 at 2056 ROOM: MH10/MH10   CHIEF COMPLAINT  Abdominal Pain   HISTORY OF PRESENT ILLNESS  11/24/19 3:46 AM Marissa Lin is a 49 y.o. female with 1 to 2 days of abdominal pain that began in the left lower quadrant and then moved to the midline.  She describes it as stabbing in nature.  It comes and goes without any known triggers.  She estimates it to be a 7 out of 10 presently but has noted it waxes and wanes to higher and lower severities.  She has had nausea but no vomiting or diarrhea.  She denies vaginal bleeding, vaginal discharge, dysuria and hematuria.  She has had no fever or chills.  She has had no cough or shortness of breath.   Past Medical History:  Diagnosis Date   Arthritis    Carpal tunnel syndrome    Metatarsal deformity    Migraine    Plantar fasciitis     Past Surgical History:  Procedure Laterality Date   ABDOMINAL HYSTERECTOMY     CESAREAN SECTION      No family history on file.  Social History   Tobacco Use   Smoking status: Never Smoker   Smokeless tobacco: Never Used  Substance Use Topics   Alcohol use: No    Alcohol/week: 0.0 standard drinks   Drug use: No    Prior to Admission medications   Medication Sig Start Date End Date Taking? Authorizing Provider  dicyclomine (BENTYL) 20 MG tablet Take 1 tablet (20 mg total) by mouth 4 (four) times daily as needed for spasms. 11/24/19   Timea Breed, MD  montelukast (SINGULAIR) 10 MG tablet Take 10 mg by mouth at bedtime.    [provider]  ondansetron (ZOFRAN ODT) 8 MG disintegrating tablet Take 1 tablet (8 mg total) by mouth every 8 (eight) hours as needed for nausea or vomiting. 11/24/19   Sahian Kerney, Jonny Ruiz, MD  cetirizine (ZYRTEC) 10 MG tablet Take 1 tablet (10 mg total) by mouth daily. 02/25/17 08/04/19  Maczis, Elmer Sow, PA-C  fluticasone (FLONASE) 50  MCG/ACT nasal spray Place 2 sprays into both nostrils daily. 02/25/17 08/04/19  Maczis, Elmer Sow, PA-C    Allergies Nitrofurantoin, Other, and Diflucan [fluconazole]   REVIEW OF SYSTEMS  Negative except as noted here or in the History of Present Illness.   PHYSICAL EXAMINATION  Initial Vital Signs Blood pressure (!) 145/86, pulse 74, temperature 98.8 F (37.1 C), temperature source Oral, resp. rate 16, height 5' (1.524 m), weight 101.8 kg, SpO2 97 %.  Examination General: Well-developed, well-nourished female in no acute distress; appearance consistent with age of record HENT: normocephalic; atraumatic Eyes: pupils equal, round and reactive to light; extraocular muscles intact Neck: supple Heart: regular rate and rhythm Lungs: clear to auscultation bilaterally Abdomen: soft; nondistended; nontender; bowel sounds present Extremities: No deformity; full range of motion; pulses normal Neurologic: Awake, alert and oriented; motor function intact in all extremities and symmetric; no facial droop Skin: Warm and dry Psychiatric: Normal mood and affect   RESULTS  Summary of this visit's results, reviewed and interpreted by myself:   EKG Interpretation  Date/Time:    Ventricular Rate:    PR Interval:    QRS Duration:   QT Interval:    QTC Calculation:   R Axis:     Text Interpretation:        Laboratory Studies: Results for orders  placed or performed during the hospital encounter of 11/23/19 (from the past 24 hour(s))  Lipase, blood     Status: None   Collection Time: 11/23/19  9:25 PM  Result Value Ref Range   Lipase 25 11 - 51 U/L  Comprehensive metabolic panel     Status: Abnormal   Collection Time: 11/23/19  9:25 PM  Result Value Ref Range   Sodium 138 135 - 145 mmol/L   Potassium 3.8 3.5 - 5.1 mmol/L   Chloride 107 98 - 111 mmol/L   CO2 23 22 - 32 mmol/L   Glucose, Bld 109 (H) 70 - 99 mg/dL   BUN 12 6 - 20 mg/dL   Creatinine, Ser 2.68 0.44 - 1.00 mg/dL    Calcium 8.6 (L) 8.9 - 10.3 mg/dL   Total Protein 7.8 6.5 - 8.1 g/dL   Albumin 4.1 3.5 - 5.0 g/dL   AST 18 15 - 41 U/L   ALT 19 0 - 44 U/L   Alkaline Phosphatase 59 38 - 126 U/L   Total Bilirubin 0.4 0.3 - 1.2 mg/dL   GFR calc non Af Amer >60 >60 mL/min   GFR calc Af Amer >60 >60 mL/min   Anion gap 8 5 - 15  CBC     Status: None   Collection Time: 11/23/19  9:25 PM  Result Value Ref Range   WBC 7.1 4.0 - 10.5 K/uL   RBC 4.66 3.87 - 5.11 MIL/uL   Hemoglobin 12.5 12.0 - 15.0 g/dL   HCT 34.1 36 - 46 %   MCV 84.5 80.0 - 100.0 fL   MCH 26.8 26.0 - 34.0 pg   MCHC 31.7 30.0 - 36.0 g/dL   RDW 96.2 22.9 - 79.8 %   Platelets 372 150 - 400 K/uL   nRBC 0.0 0.0 - 0.2 %  Urinalysis, Routine w reflex microscopic Urine, Clean Catch     Status: None   Collection Time: 11/23/19  9:25 PM  Result Value Ref Range   Color, Urine YELLOW YELLOW   APPearance CLEAR CLEAR   Specific Gravity, Urine 1.020 1.005 - 1.030   pH 7.5 5.0 - 8.0   Glucose, UA NEGATIVE NEGATIVE mg/dL   Hgb urine dipstick NEGATIVE NEGATIVE   Bilirubin Urine NEGATIVE NEGATIVE   Ketones, ur NEGATIVE NEGATIVE mg/dL   Protein, ur NEGATIVE NEGATIVE mg/dL   Nitrite NEGATIVE NEGATIVE   Leukocytes,Ua NEGATIVE NEGATIVE   Imaging Studies: CT ABDOMEN PELVIS W CONTRAST  Result Date: 11/24/2019 CLINICAL DATA:  Left lower quadrant pain for 1 day EXAM: CT ABDOMEN AND PELVIS WITH CONTRAST TECHNIQUE: Multidetector CT imaging of the abdomen and pelvis was performed using the standard protocol following bolus administration of intravenous contrast. CONTRAST:  100 mL omni 350 IV COMPARISON:  None FINDINGS: Lower chest: Lung bases are clear. Normal heart size. No pericardial effusion. Hepatobiliary: No worrisome focal liver abnormality is seen. Normal gallbladder. No visible calcified gallstones. No biliary ductal dilatation. Pancreas: Unremarkable. No pancreatic ductal dilatation or surrounding inflammatory changes. Spleen: Normal in size. No  concerning splenic lesions. Small accessory splenule towards the hilum. Adrenals/Urinary Tract: Normal adrenal glands kidneys enhance and excrete uniformly and symmetrically. No concerning renal mass. No urolithiasis or hydronephrosis. Urinary bladder is largely decompressed at the time of exam and therefore poorly evaluated by CT imaging. No gross bladder abnormality is seen. Stomach/Bowel: Distal esophagus, stomach and duodenal sweep are unremarkable. No small bowel wall thickening or dilatation. No evidence of obstruction. A normal appendix is visualized. No  significant colonic dilatation or wall thickening accounting for the degree of distention. Vascular/Lymphatic: Multiple vascular phleboliths in the pelvis. Additional calcifications seen along the course of the left gonadal vein. Abdominal aorta is normal caliber. No aneurysm or ectasia. No suspicious or enlarged lymph nodes in the included lymphatic chains. Reproductive: Uterus is surgically absent. No concerning adnexal lesions. Other: No abdominopelvic free fluid or free gas. No bowel containing hernias. Small fat containing umbilical hernia. Musculoskeletal: No acute osseous abnormality or suspicious osseous lesion. Multilevel degenerative changes are present in the imaged portions of the spine. Minimal 3 mm of retrolisthesis L5 on S1 without spondylolysis. Additional mild degenerative changes in the hips and pelvis. IMPRESSION: 1. No acute intra-abdominal process to provide cause for patient's left lower quadrant pain. 2. Diverticulosis without evidence of acute diverticulitis. 3. Prior hysterectomy. Electronically Signed   By: Kreg Shropshire M.D.   On: 11/24/2019 03:42    ED COURSE and MDM  Nursing notes, initial and subsequent vitals signs, including pulse oximetry, reviewed and interpreted by myself.  Vitals:   11/23/19 2116 11/23/19 2117  BP: (!) 145/86   Pulse: 74   Resp: 16   Temp: 98.8 F (37.1 C)   TempSrc: Oral   SpO2: 97%   Weight:   101.8 kg  Height:  5' (1.524 m)   Medications  ondansetron (ZOFRAN) injection 4 mg (4 mg Intravenous Given 11/24/19 0124)  fentaNYL (SUBLIMAZE) injection 100 mcg (100 mcg Intravenous Given 11/24/19 0125)  iohexol (OMNIPAQUE) 300 MG/ML solution 100 mL (100 mLs Intravenous Contrast Given 11/24/19 0132)   3:48 AM Patient advised of reassuring diagnostic studies.  We will trial Bentyl.  The exact cause of her pain is unclear at this time.  Patient's pain is improved at this time.  Her abdomen is soft and nontender.   PROCEDURES  Procedures   ED DIAGNOSES     ICD-10-CM   1. Lower abdominal pain  R10.30        Dasan Hardman, Jonny Ruiz, MD 11/24/19 [phone]

## 2020-01-04 ENCOUNTER — Encounter (INDEPENDENT_AMBULATORY_CARE_PROVIDER_SITE_OTHER): Payer: Self-pay | Admitting: Bariatrics

## 2020-01-04 ENCOUNTER — Ambulatory Visit (INDEPENDENT_AMBULATORY_CARE_PROVIDER_SITE_OTHER): Payer: Managed Care, Other (non HMO) | Admitting: Bariatrics

## 2020-01-04 ENCOUNTER — Other Ambulatory Visit: Payer: Self-pay

## 2020-01-04 VITALS — BP 135/85 | HR 61 | Temp 98.4°F | Resp 18 | Ht 63.0 in | Wt 220.0 lb

## 2020-01-04 DIAGNOSIS — Z6841 Body Mass Index (BMI) 40.0 and over, adult: Secondary | ICD-10-CM

## 2020-01-04 DIAGNOSIS — Z9189 Other specified personal risk factors, not elsewhere classified: Secondary | ICD-10-CM

## 2020-01-04 DIAGNOSIS — Z1331 Encounter for screening for depression: Secondary | ICD-10-CM

## 2020-01-04 DIAGNOSIS — R0602 Shortness of breath: Secondary | ICD-10-CM

## 2020-01-04 DIAGNOSIS — R030 Elevated blood-pressure reading, without diagnosis of hypertension: Secondary | ICD-10-CM

## 2020-01-04 DIAGNOSIS — K5909 Other constipation: Secondary | ICD-10-CM

## 2020-01-04 DIAGNOSIS — E559 Vitamin D deficiency, unspecified: Secondary | ICD-10-CM | POA: Diagnosis not present

## 2020-01-04 DIAGNOSIS — M722 Plantar fascial fibromatosis: Secondary | ICD-10-CM

## 2020-01-04 DIAGNOSIS — D508 Other iron deficiency anemias: Secondary | ICD-10-CM

## 2020-01-04 DIAGNOSIS — R7309 Other abnormal glucose: Secondary | ICD-10-CM

## 2020-01-04 DIAGNOSIS — Z0289 Encounter for other administrative examinations: Secondary | ICD-10-CM

## 2020-01-04 DIAGNOSIS — R5383 Other fatigue: Secondary | ICD-10-CM

## 2020-01-05 ENCOUNTER — Encounter (INDEPENDENT_AMBULATORY_CARE_PROVIDER_SITE_OTHER): Payer: Self-pay | Admitting: Bariatrics

## 2020-01-05 DIAGNOSIS — E559 Vitamin D deficiency, unspecified: Secondary | ICD-10-CM | POA: Insufficient documentation

## 2020-01-05 DIAGNOSIS — R7303 Prediabetes: Secondary | ICD-10-CM | POA: Insufficient documentation

## 2020-01-05 LAB — T3: T3, Total: 106 ng/dL (ref 71–180)

## 2020-01-05 LAB — CBC WITH DIFFERENTIAL/PLATELET
Basophils Absolute: 0.1 10*3/uL (ref 0.0–0.2)
Basos: 1 %
EOS (ABSOLUTE): 0.1 10*3/uL (ref 0.0–0.4)
Eos: 3 %
Hematocrit: 35.7 % (ref 34.0–46.6)
Hemoglobin: 11.4 g/dL (ref 11.1–15.9)
Immature Grans (Abs): 0 10*3/uL (ref 0.0–0.1)
Immature Granulocytes: 0 %
Lymphocytes Absolute: 1.7 10*3/uL (ref 0.7–3.1)
Lymphs: 32 %
MCH: 26.1 pg — ABNORMAL LOW (ref 26.6–33.0)
MCHC: 31.9 g/dL (ref 31.5–35.7)
MCV: 82 fL (ref 79–97)
Monocytes Absolute: 0.4 10*3/uL (ref 0.1–0.9)
Monocytes: 7 %
Neutrophils Absolute: 3.2 10*3/uL (ref 1.4–7.0)
Neutrophils: 57 %
Platelets: 363 10*3/uL (ref 150–450)
RBC: 4.37 x10E6/uL (ref 3.77–5.28)
RDW: 13.9 % (ref 11.7–15.4)
WBC: 5.5 10*3/uL (ref 3.4–10.8)

## 2020-01-05 LAB — COMPREHENSIVE METABOLIC PANEL
ALT: 14 IU/L (ref 0–32)
AST: 16 IU/L (ref 0–40)
Albumin/Globulin Ratio: 1.4 (ref 1.2–2.2)
Albumin: 4.2 g/dL (ref 3.8–4.8)
Alkaline Phosphatase: 67 IU/L (ref 44–121)
BUN/Creatinine Ratio: 17 (ref 9–23)
BUN: 11 mg/dL (ref 6–24)
Bilirubin Total: 0.3 mg/dL (ref 0.0–1.2)
CO2: 24 mmol/L (ref 20–29)
Calcium: 8.8 mg/dL (ref 8.7–10.2)
Chloride: 102 mmol/L (ref 96–106)
Creatinine, Ser: 0.63 mg/dL (ref 0.57–1.00)
GFR calc Af Amer: 121 mL/min/{1.73_m2} (ref >59)
GFR calc non Af Amer: 105 mL/min/{1.73_m2} (ref >59)
Globulin, Total: 2.9 g/dL (ref 1.5–4.5)
Glucose: 78 mg/dL (ref 65–99)
Potassium: 3.8 mmol/L (ref 3.5–5.2)
Sodium: 140 mmol/L (ref 134–144)
Total Protein: 7.1 g/dL (ref 6.0–8.5)

## 2020-01-05 LAB — TSH: TSH: 2.61 u[IU]/mL (ref 0.450–4.500)

## 2020-01-05 LAB — LIPID PANEL WITH LDL/HDL RATIO
Cholesterol, Total: 231 mg/dL — ABNORMAL HIGH (ref 100–199)
HDL: 50 mg/dL (ref >39)
LDL Chol Calc (NIH): 172 mg/dL — ABNORMAL HIGH (ref 0–99)
LDL/HDL Ratio: 3.4 ratio — ABNORMAL HIGH (ref 0.0–3.2)
Triglycerides: 55 mg/dL (ref 0–149)
VLDL Cholesterol Cal: 9 mg/dL (ref 5–40)

## 2020-01-05 LAB — T4, FREE: Free T4: 0.93 ng/dL (ref 0.82–1.77)

## 2020-01-05 LAB — VITAMIN D 25 HYDROXY (VIT D DEFICIENCY, FRACTURES): Vit D, 25-Hydroxy: 17.1 ng/mL — ABNORMAL LOW (ref 30.0–100.0)

## 2020-01-05 LAB — INSULIN, RANDOM: INSULIN: 12.4 u[IU]/mL (ref 2.6–24.9)

## 2020-01-05 LAB — HEMOGLOBIN A1C
Est. average glucose Bld gHb Est-mCnc: 123 mg/dL
Hgb A1c MFr Bld: 5.9 % — ABNORMAL HIGH (ref 4.8–5.6)

## 2020-01-05 NOTE — Progress Notes (Signed)
Chief Complaint:   OBESITY Marissa Lin (MR# [id number]) is a 49 y.o. female who presents for evaluation and treatment of obesity and related comorbidities. Current BMI is Body mass index is 38.97 kg/m. Marissa Lin has been struggling with her weight for many years and has been unsuccessful in either losing weight, maintaining weight loss, or reaching her healthy weight goal.  Marissa Lin is currently in the action stage of change and ready to dedicate time achieving and maintaining a healthier weight. Marissa Lin is interested in becoming our patient and working on intensive lifestyle modifications including (but not limited to) diet and exercise for weight loss.  Marissa Lin's habits were reviewed today and are as follows: Her family eats meals together, her desired weight loss is 60 lbs, she has been heavy most of her life, she started gaining weight after being pregnant, her heaviest weight ever was 235 pounds, she has significant food cravings issues, she skips meals frequently, she is frequently drinking liquids with calories, she frequently makes poor food choices and she frequently eats larger portions than normal.  Depression Screen Marissa Lin's Food and Mood (modified PHQ-9) score was 7.  Depression screen PHQ 2/9 01/04/2020  Decreased Interest 1  Down, Depressed, Hopeless 2  PHQ - 2 Score 3  Altered sleeping 1  Tired, decreased energy 1  Change in appetite 1  Feeling bad or failure about yourself  0  Trouble concentrating 1  Moving slowly or fidgety/restless 0  Suicidal thoughts 0  PHQ-9 Score 7  Difficult doing work/chores Not difficult at all   Subjective:   1. Other fatigue Marissa Lin admits to daytime somnolence and admits to waking up still tired. Patent has a history of symptoms of daytime fatigue and morning headache. Marissa Lin generally gets 6 or 8 hours of sleep per night, and states that she has generally restful sleep. Snoring is present. Apneic episodes are not present. Epworth  Sleepiness Score is 3.  2. SOB (shortness of breath) on exertion Marissa Lin notes increasing shortness of breath with exercising and seems to be worsening over time with weight gain. She notes getting out of breath sooner with activity than she used to. This has not gotten worse recently. Marissa Lin denies shortness of breath at rest or orthopnea.  3. Elevated glucose Marissa Lin has a strong family history of diabetes mellitus. Last glucose level was 109.  4. Plantar fasciitis of left foot Marissa Lin saw a Podiatrist last year for her left foot plantar fasciitis.  5. Elevated blood pressure reading Marissa Lin's first reading today was 153/94 and second reading was 135/85. She notes back pain today and took Aleve.  6. Other constipation Marissa Lin notes constipation from eating certain foods.  7. Vitamin D deficiency Marissa Lin is not on Vit D.  8. Other iron deficiency anemia Marissa Lin is not on medications.   9. At risk for activity intolerance Marissa Lin is at risk for exercise intolerance due to obesity and plantar fasciitis.  Assessment/Plan:   1. Other fatigue Marissa Lin does feel that her weight is causing her energy to be lower than it should be. Fatigue may be related to obesity, depression or many other causes. Labs will be ordered, and in the meanwhile, Marissa Lin will focus on self care including making healthy food choices, increasing physical activity and focusing on stress reduction.  - EKG 12-Lead - CBC with Differential/Platelet - Comprehensive metabolic panel - Hemoglobin A1c - Insulin, random - Lipid Panel With LDL/HDL Ratio - T3 - T4, free - TSH - VITAMIN D 25 Hydroxy (  Vit-D Deficiency, Fractures)  2. SOB (shortness of breath) on exertion Marissa Lin does feel that she gets out of breath more easily that she used to when she exercises. Marissa Lin's shortness of breath appears to be obesity related and exercise induced. She has agreed to work on weight loss and gradually increase exercise to treat her exercise induced  shortness of breath. Will continue to monitor closely.  3. Elevated glucose We will check labs today. Marissa Lin will follow up as directed.  - Hemoglobin A1c - Insulin, random  4. Plantar fasciitis of left foot Marissa Lin will follow up with Podiatry.  5. Elevated blood pressure reading Marissa Lin will increase water, and will continue working on healthy weight loss and exercise to improve blood pressure control. We will watch for signs of hypotension as she continues her lifestyle modifications.  6. Other constipation Marissa Lin was informed that a decrease in bowel movement frequency is normal while losing weight, but stools should not be hard or painful. Orders and follow up as documented in patient record.   Counseling Getting to Good Bowel Health: Your goal is to have one soft bowel movement each day. Drink at least 8 glasses of water each day. Eat plenty of fiber (goal is over 25 grams each day). It is best to get most of your fiber from dietary sources which includes leafy green vegetables, fresh fruit, and whole grains. You may need to add fiber with the help of OTC fiber supplements. These include Metamucil, Citrucel, and Flaxseed. If you are still having trouble, try adding Miralax or Magnesium Citrate. If all of these changes do not work, Dietitian.  7. Vitamin D deficiency Low Vitamin D level contributes to fatigue and are associated with obesity, breast, and colon cancer. We will check labs today. Marissa Lin will follow-up for routine testing of Vitamin D, at least 2-3 times per year to avoid over-replacement.  - VITAMIN D 25 Hydroxy (Vit-D Deficiency, Fractures)  8. Other iron deficiency anemia We will check labs today. Orders and follow up as documented in patient record.  Counseling . Iron is essential for our bodies to make red blood cells.  Reasons that someone may be deficient include: an iron-deficient diet (more likely in those following vegan or vegetarian diets), women with  heavy menses, patients with GI disorders or poor absorption, patients that have had bariatric surgery, frequent blood donors, patients with cancer, and patients with heart disease.   [place]a foods include dark leafy greens, red and white meats, eggs, seafood, and beans.   . Certain foods and drinks prevent your body from absorbing iron properly. Avoid eating these foods in the same meal as iron-rich foods or with iron supplements. These foods include: coffee, black tea, and red wine; milk, dairy products, and foods that are high in calcium; beans and soybeans; whole grains.  . Constipation can be a side effect of iron supplementation. Increased water and fiber intake are helpful. Water goal: > 2 liters/day. Fiber goal: > 25 grams/day.  - CBC with Differential/Platelet  9. Depression screening Marissa Lin had a positive depression screening. Depression is commonly associated with obesity and often results in emotional eating behaviors. We will monitor this closely and work on CBT to help improve the non-hunger eating patterns. Referral to Psychology may be required if no improvement is seen as she continues in our clinic.  10. At risk for activity intolerance Marissa Lin was given approximately 15 minutes of exercise intolerance counseling today. She is 49 y.o. female and has risk  factors exercise intolerance including obesity. We discussed intensive lifestyle modifications today with an emphasis on specific weight loss instructions and strategies. Marissa Lin will slowly increase activity as tolerated.  Repetitive spaced learning was employed today to elicit superior memory formation and behavioral change.  11. Class 3 severe obesity with serious comorbidity and body mass index (BMI) of 40.0 to 44.9 in adult, unspecified obesity type (HCC) Marissa Lin is currently in the action stage of change and her goal is to continue with weight loss efforts. I recommend Marissa Lin begin the structured treatment plan as follows:  She  has agreed to the Category 1 Plan.  Marissa Lin can occasionally have a protein shake for breakfast.  Exercise goals: No exercise has been prescribed at this time.   Behavioral modification strategies: increasing lean protein intake, decreasing simple carbohydrates, increasing vegetables, increasing water intake, no skipping meals, meal planning and cooking strategies, keeping healthy foods in the home and planning for success.  She was informed of the importance of frequent follow-up visits to maximize her success with intensive lifestyle modifications for her multiple health conditions. She was informed we would discuss her lab results at her next visit unless there is a critical issue that needs to be addressed sooner. Marissa Lin agreed to keep her next visit at the agreed upon time to discuss these results.  Objective:   Blood pressure 135/85, pulse 61, temperature 98.4 F (36.9 C), resp. rate 18, height 5\' 3"  (1.6 m), weight 220 lb (99.8 kg), SpO2 99 %. Body mass index is 38.97 kg/m.  EKG: Normal sinus rhythm, rate 56 BPM.  Indirect Calorimeter completed today shows a VO2 of 156 and a REE of 1084.  Her calculated basal metabolic rate is thus her basal metabolic rate is worse than expected.  General: Cooperative, alert, well developed, in no acute distress. HEENT: Conjunctivae and lids unremarkable. Cardiovascular: Regular rhythm.  Lungs: Normal work of breathing. Neurologic: No focal deficits.   Lab Results  Component Value Date   CREATININE 0.63 01/04/2020   BUN 11 01/04/2020   NA 140 01/04/2020   K 3.8 01/04/2020   CL 102 01/04/2020   CO2 24 01/04/2020   Lab Results  Component Value Date   ALT 14 01/04/2020   AST 16 01/04/2020   ALKPHOS 67 01/04/2020   BILITOT 0.3 01/04/2020   Lab Results  Component Value Date   HGBA1C 5.9 (H) 01/04/2020   Lab Results  Component Value Date   INSULIN 12.4 01/04/2020   Lab Results  Component Value Date   TSH 2.610 01/04/2020   Lab  Results  Component Value Date   CHOL 231 (H) 01/04/2020   HDL 50 01/04/2020   LDLCALC 172 (H) 01/04/2020   TRIG 55 01/04/2020   Lab Results  Component Value Date   WBC 5.5 01/04/2020   HGB 11.4 01/04/2020   HCT 35.7 01/04/2020   MCV 82 01/04/2020   PLT 363 01/04/2020   No results found for: IRON, TIBC, FERRITIN  Attestation Statements:   Reviewed by clinician on day of visit: allergies, medications, problem list, medical history, surgical history, family history, social history, and previous encounter notes.   01/06/2020, am acting as Trude Mcburney for Energy manager, DO.  I have reviewed the above documentation for accuracy and completeness, and I agree with the above. [employer], DO

## 2020-01-18 ENCOUNTER — Other Ambulatory Visit: Payer: Self-pay

## 2020-01-18 ENCOUNTER — Encounter (INDEPENDENT_AMBULATORY_CARE_PROVIDER_SITE_OTHER): Payer: Self-pay | Admitting: Bariatrics

## 2020-01-18 ENCOUNTER — Ambulatory Visit (INDEPENDENT_AMBULATORY_CARE_PROVIDER_SITE_OTHER): Payer: Managed Care, Other (non HMO) | Admitting: Bariatrics

## 2020-01-18 VITALS — BP 135/84 | HR 66 | Temp 98.2°F | Ht 63.0 in | Wt 216.0 lb

## 2020-01-18 DIAGNOSIS — R7303 Prediabetes: Secondary | ICD-10-CM | POA: Diagnosis not present

## 2020-01-18 DIAGNOSIS — E7800 Pure hypercholesterolemia, unspecified: Secondary | ICD-10-CM | POA: Diagnosis not present

## 2020-01-18 DIAGNOSIS — Z9189 Other specified personal risk factors, not elsewhere classified: Secondary | ICD-10-CM

## 2020-01-18 DIAGNOSIS — E669 Obesity, unspecified: Secondary | ICD-10-CM

## 2020-01-18 DIAGNOSIS — Z6834 Body mass index (BMI) 34.0-34.9, adult: Secondary | ICD-10-CM

## 2020-01-18 DIAGNOSIS — E559 Vitamin D deficiency, unspecified: Secondary | ICD-10-CM | POA: Diagnosis not present

## 2020-01-18 MED ORDER — VITAMIN D (ERGOCALCIFEROL) 1.25 MG (50000 UNIT) PO CAPS: 50000.0000 [IU] | ORAL_CAPSULE | ORAL | 0 refills | Status: DC

## 2020-01-18 MED ORDER — METFORMIN HCL 500 MG PO TABS: 500.0000 mg | ORAL_TABLET | Freq: Every day | ORAL | 0 refills | Status: DC

## 2020-01-18 NOTE — Telephone Encounter (Signed)
Spoke to pt and she does not want to take and will discuss with Dr. Manson Passey at next OV>

## 2020-01-18 NOTE — Telephone Encounter (Signed)
Please review

## 2020-01-19 ENCOUNTER — Encounter (INDEPENDENT_AMBULATORY_CARE_PROVIDER_SITE_OTHER): Payer: Self-pay | Admitting: Bariatrics

## 2020-01-19 NOTE — Progress Notes (Signed)
Chief Complaint:   OBESITY Marissa Lin is here to discuss her progress with her obesity treatment plan along with follow-up of her obesity related diagnoses. Marissa Lin is on the Category 1 Plan and states she is following her eating plan approximately 60% of the time. Marissa Lin states she is exercising 0 minutes 0 times per week.  Today's visit was #: 2 Starting weight: 220 lbs Starting date: 01/04/2020 Today's weight: 216 lbs Today's date: 01/18/2020 Total lbs lost to date: 4 Total lbs lost since last in-office visit: 4  Interim History: Marissa Lin is down 4 lbs since her last visit. She states that she is very stressed.  Subjective:   Vitamin D deficiency. Miguel is dark skinned and reports little sun exposure.   Ref. Range 01/04/2020 11:30  Vitamin D, 25-Hydroxy Latest Ref Range: 30.0 - 100.0 ng/mL 17.1 (L)   Prediabetes. Marissa Lin has a diagnosis of prediabetes based on her elevated HgA1c and was informed this puts her at greater risk of developing diabetes. She continues to work on diet and exercise to decrease her risk of diabetes. She denies nausea or hypoglycemia. Marissa Lin has a strong family history of diabetes.  Lab Results  Component Value Date   HGBA1C 5.9 (H) 01/04/2020   Lab Results  Component Value Date   INSULIN 12.4 01/04/2020   Elevated cholesterol. Marissa Lin is on no medication.  At risk for osteoporosis. Marissa Lin is at higher risk of osteopenia and osteoporosis due to Vitamin D deficiency.   Assessment/Plan:   Vitamin D deficiency. Low Vitamin D level contributes to fatigue and are associated with obesity, breast, and colon cancer. She was given a prescription for Vitamin D, Ergocalciferol, (DRISDOL) 1.25 MG (50000 UNIT) CAPS capsule every week #4 with 0 refills and will follow-up for routine testing of Vitamin D, at least 2-3 times per year to avoid over-replacement.   Prediabetes. Charlena will continue to work on weight loss, exercise, and decreasing simple  carbohydrates to help decrease the risk of diabetes. Handout was given on Metformin. Prescription was given for metFORMIN (GLUCOPHAGE) 500 MG tablet 1 daily at lunch #30 with 0 refills.  Elevated cholesterol. [name] will make diet modifications to lower her cholesterol level.  At risk for osteoporosis. Marissa Lin was given approximately 15 minutes of osteoporosis prevention counseling today. Marissa Lin is at risk for osteopenia and osteoporosis due to her Vitamin D deficiency. She was encouraged to take her Vitamin D and follow her higher calcium diet and increase strengthening exercise to help strengthen her bones and decrease her risk of osteopenia and osteoporosis.  Repetitive spaced learning was employed today to elicit superior memory formation and behavioral change.  Class 1 obesity with serious comorbidity and body mass index (BMI) of 34.0 to 34.9 in adult, unspecified obesity type.  Marissa Lin is currently in the action stage of change. As such, her goal is to continue with weight loss efforts. She has agreed to the Category 1 Plan.   She will work on meal planning, intentional eating, and increasing her water and protein intake. She will pack her lunch.  We reviewed with the patient labs from 01/04/2020 including CMP, lipids, Vitamin D, CBC, A1c, insulin, and thyroid panel.  Exercise goals: All adults should avoid inactivity. Some physical activity is better than none, and adults who participate in any amount of physical activity gain some health benefits.  Behavioral modification strategies: increasing lean protein intake, decreasing simple carbohydrates, increasing vegetables, increasing water intake, decreasing eating out, no skipping meals, meal planning and  cooking strategies, keeping healthy foods in the home, ways to avoid boredom eating, ways to avoid night time snacking, better snacking choices, emotional eating strategies and planning for success.  Marissa Lin has agreed to follow-up with our clinic  in 3 weeks. She was informed of the importance of frequent follow-up visits to maximize her success with intensive lifestyle modifications for her multiple health conditions.   Objective:   Blood pressure 135/84, pulse 66, temperature 98.2 F (36.8 C), height 5\' 3"  (1.6 m), weight 216 lb (98 kg), SpO2 100 %. Body mass index is 38.26 kg/m.  General: Cooperative, alert, well developed, in no acute distress. HEENT: Conjunctivae and lids unremarkable. Cardiovascular: Regular rhythm.  Lungs: Normal work of breathing. Neurologic: No focal deficits.   Lab Results  Component Value Date   CREATININE 0.63 01/04/2020   BUN 11 01/04/2020   NA 140 01/04/2020   K 3.8 01/04/2020   CL 102 01/04/2020   CO2 24 01/04/2020   Lab Results  Component Value Date   ALT 14 01/04/2020   AST 16 01/04/2020   ALKPHOS 67 01/04/2020   BILITOT 0.3 01/04/2020   Lab Results  Component Value Date   HGBA1C 5.9 (H) 01/04/2020   Lab Results  Component Value Date   INSULIN 12.4 01/04/2020   Lab Results  Component Value Date   TSH 2.610 01/04/2020   Lab Results  Component Value Date   CHOL 231 (H) 01/04/2020   HDL 50 01/04/2020   LDLCALC 172 (H) 01/04/2020   TRIG 55 01/04/2020   Lab Results  Component Value Date   WBC 5.5 01/04/2020   HGB 11.4 01/04/2020   HCT 35.7 01/04/2020   MCV 82 01/04/2020   PLT 363 01/04/2020   No results found for: IRON, TIBC, FERRITIN  Attestation Statements:   Reviewed by clinician on day of visit: allergies, medications, problem list, medical history, surgical history, family history, social history, and previous encounter notes.  01/06/2020, am acting as Fernanda Drum for Energy manager, DO   I have reviewed the above documentation for accuracy and completeness, and I agree with the above. [employer], DO

## 2020-02-13 ENCOUNTER — Encounter (INDEPENDENT_AMBULATORY_CARE_PROVIDER_SITE_OTHER): Payer: Self-pay | Admitting: Bariatrics

## 2020-02-13 ENCOUNTER — Ambulatory Visit (INDEPENDENT_AMBULATORY_CARE_PROVIDER_SITE_OTHER): Payer: Managed Care, Other (non HMO) | Admitting: Bariatrics

## 2020-02-13 ENCOUNTER — Other Ambulatory Visit: Payer: Self-pay

## 2020-02-13 VITALS — BP 135/82 | HR 61 | Temp 97.9°F | Ht 63.0 in | Wt 214.0 lb

## 2020-02-13 DIAGNOSIS — R252 Cramp and spasm: Secondary | ICD-10-CM

## 2020-02-13 DIAGNOSIS — E559 Vitamin D deficiency, unspecified: Secondary | ICD-10-CM

## 2020-02-13 DIAGNOSIS — R7303 Prediabetes: Secondary | ICD-10-CM | POA: Diagnosis not present

## 2020-02-13 DIAGNOSIS — Z9189 Other specified personal risk factors, not elsewhere classified: Secondary | ICD-10-CM | POA: Diagnosis not present

## 2020-02-13 DIAGNOSIS — Z6838 Body mass index (BMI) 38.0-38.9, adult: Secondary | ICD-10-CM

## 2020-02-13 MED ORDER — VITAMIN D (ERGOCALCIFEROL) 1.25 MG (50000 UNIT) PO CAPS: 50000.0000 [IU] | ORAL_CAPSULE | ORAL | 0 refills | Status: DC

## 2020-02-13 MED ORDER — INSULIN PEN NEEDLE 32G X 4 MM MISC: 0 refills | Status: DC

## 2020-02-13 MED ORDER — SAXENDA 18 MG/3ML ~~LOC~~ SOPN: 0.6000 mg | PEN_INJECTOR | Freq: Every day | SUBCUTANEOUS | 1 refills | Status: DC

## 2020-02-14 ENCOUNTER — Encounter (INDEPENDENT_AMBULATORY_CARE_PROVIDER_SITE_OTHER): Payer: Self-pay | Admitting: Bariatrics

## 2020-02-14 NOTE — Progress Notes (Signed)
Chief Complaint:   OBESITY Marissa Lin is here to discuss her progress with her obesity treatment plan along with follow-up of her obesity related diagnoses. Marissa Lin is on the Category 1 Plan and states she is following her eating plan approximately 50% of the time. [name] states she is exercising 0 minutes 0 times per week.  Today's visit was #: 3 Starting weight: 220 lbs Starting date: 01/04/2020 Today's weight: 214 lbs Today's date: 02/13/2020 Total lbs lost to date: 6 Total lbs lost since last in-office visit: 2  Interim History: Marissa Lin is down an additional 2 lbs since her last visit. She states she skips breakfast most days.  Subjective:   Vitamin D deficiency. Marissa Lin is on Vitamin D supplementation and reports taking it as prescribed.   Ref. Range 01/04/2020 11:30  Vitamin D, 25-Hydroxy Latest Ref Range: 30.0 - 100.0 ng/mL 17.1 (L)   Prediabetes. Marissa Lin has a diagnosis of prediabetes based on her elevated HgA1c and was informed this puts her at greater risk of developing diabetes. She continues to work on diet and exercise to decrease her risk of diabetes. She denies nausea or hypoglycemia. 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  Lab Results  Component Value Date   HGBA1C 5.9 (H) 01/04/2020   Lab Results  Component Value Date   INSULIN 12.4 01/04/2020   Leg cramps. [name] reports leg cramps with walking and moving.  At risk of diabetes mellitus. [name] is at higher than average risk for developing diabetes due to prediabetes.   Assessment/Plan:   Vitamin D deficiency. Low Vitamin D level contributes to fatigue and are associated with obesity, breast, and colon cancer. She was given a prescription for Vitamin D, Ergocalciferol, (DRISDOL) 1.25 MG (50000 UNIT) CAPS capsule every week #4 with 0 refills and will follow-up for routine testing of Vitamin D, at least 2-3 times per year to avoid over-replacement.    Prediabetes. Marissa Lin will continue to work on weight loss, exercise, and decreasing simple carbohydrates to help decrease the risk of diabetes. We discussed metformin with the patient today.  Leg cramps. Marissa Lin will try low dose OTC magnesium.  At risk of diabetes mellitus. Marissa Lin was given approximately 15 minutes of diabetes education and counseling today. We discussed intensive lifestyle modifications today with an emphasis on weight loss as well as increasing exercise and decreasing simple carbohydrates in her diet. We also reviewed medication options with an emphasis on risk versus benefit of those discussed.   Repetitive spaced learning was employed today to elicit superior memory formation and behavioral change.  Class 2 severe obesity with serious comorbidity and body mass index (BMI) of 38.0 to 38.9 in adult, unspecified obesity type (HCC). Prescriptions were given for Liraglutide -Weight Management (SAXENDA) 18 MG/3ML SOPN 0.6 mg SQ daily and Insulin Pen Needle 32G X 4 MM MISC #50 use daily with 0 refills.  Marissa Lin is currently in the action stage of change. As such, her goal is to continue with weight loss efforts. She has agreed to the Category 1 Plan.   She will adhere more closely to the plan and will increase her protein and water intake.  Exercise goals: All adults should avoid inactivity. Some physical activity is better than none, and adults who participate in any amount of physical activity gain some health benefits.  Behavioral modification strategies: increasing lean protein intake, decreasing simple carbohydrates, increasing vegetables, increasing water intake, decreasing eating out, no skipping meals, meal  planning and cooking strategies, keeping healthy foods in the home and planning for success.  Marissa Lin has agreed to follow-up with our clinic in 2-3 weeks. She was informed of the importance of frequent follow-up visits to maximize her success with intensive lifestyle  modifications for her multiple health conditions.   Objective:   Blood pressure 135/82, pulse 61, temperature 97.9 F (36.6 C), height 5\' 3"  (1.6 m), weight 214 lb (97.1 kg), SpO2 99 %. Body mass index is 37.91 kg/m.  General: Cooperative, alert, well developed, in no acute distress. HEENT: Conjunctivae and lids unremarkable. Cardiovascular: Regular rhythm.  Lungs: Normal work of breathing. Neurologic: No focal deficits.   Lab Results  Component Value Date   CREATININE 0.63 01/04/2020   BUN 11 01/04/2020   NA 140 01/04/2020   K 3.8 01/04/2020   CL 102 01/04/2020   CO2 24 01/04/2020   Lab Results  Component Value Date   ALT 14 01/04/2020   AST 16 01/04/2020   ALKPHOS 67 01/04/2020   BILITOT 0.3 01/04/2020   Lab Results  Component Value Date   HGBA1C 5.9 (H) 01/04/2020   Lab Results  Component Value Date   INSULIN 12.4 01/04/2020   Lab Results  Component Value Date   TSH 2.610 01/04/2020   Lab Results  Component Value Date   CHOL 231 (H) 01/04/2020   HDL 50 01/04/2020   LDLCALC 172 (H) 01/04/2020   TRIG 55 01/04/2020   Lab Results  Component Value Date   WBC 5.5 01/04/2020   HGB 11.4 01/04/2020   HCT 35.7 01/04/2020   MCV 82 01/04/2020   PLT 363 01/04/2020   No results found for: IRON, TIBC, FERRITIN  Attestation Statements:   Reviewed by clinician on day of visit: allergies, medications, problem list, medical history, surgical history, family history, social history, and previous encounter notes.  01/06/2020, am acting as Fernanda Drum for Energy manager, DO   I have reviewed the above documentation for accuracy and completeness, and I agree with the above. [employer], DO

## 2020-02-28 ENCOUNTER — Encounter (INDEPENDENT_AMBULATORY_CARE_PROVIDER_SITE_OTHER): Payer: Self-pay | Admitting: Bariatrics

## 2020-02-28 ENCOUNTER — Other Ambulatory Visit: Payer: Self-pay

## 2020-02-28 ENCOUNTER — Ambulatory Visit (INDEPENDENT_AMBULATORY_CARE_PROVIDER_SITE_OTHER): Payer: Managed Care, Other (non HMO) | Admitting: Bariatrics

## 2020-02-28 VITALS — BP 138/84 | HR 56 | Temp 97.9°F | Ht 63.0 in | Wt 216.0 lb

## 2020-02-28 DIAGNOSIS — Z9189 Other specified personal risk factors, not elsewhere classified: Secondary | ICD-10-CM | POA: Diagnosis not present

## 2020-02-28 DIAGNOSIS — Z6838 Body mass index (BMI) 38.0-38.9, adult: Secondary | ICD-10-CM

## 2020-02-28 DIAGNOSIS — R7303 Prediabetes: Secondary | ICD-10-CM | POA: Diagnosis not present

## 2020-02-28 DIAGNOSIS — E559 Vitamin D deficiency, unspecified: Secondary | ICD-10-CM | POA: Diagnosis not present

## 2020-02-28 MED ORDER — VITAMIN D (ERGOCALCIFEROL) 1.25 MG (50000 UNIT) PO CAPS: 50000.0000 [IU] | ORAL_CAPSULE | ORAL | 0 refills | Status: DC

## 2020-02-28 MED ORDER — TOPIRAMATE 25 MG PO TABS: 25.0000 mg | ORAL_TABLET | Freq: Two times a day (BID) | ORAL | 0 refills | Status: DC

## 2020-02-28 MED ORDER — METFORMIN HCL 500 MG PO TABS: 500.0000 mg | ORAL_TABLET | Freq: Every day | ORAL | 0 refills | Status: DC

## 2020-02-28 MED ORDER — PHENTERMINE HCL 30 MG PO CAPS: 30.0000 mg | ORAL_CAPSULE | ORAL | 0 refills | Status: DC

## 2020-02-29 NOTE — Progress Notes (Signed)
Chief Complaint:   OBESITY Marissa Lin is here to discuss her progress with her obesity treatment plan along with follow-up of her obesity related diagnoses. [name] is on the Category 1 Plan and states she is following her eating plan approximately 70% of the time. Basil states she is exercising 0 minutes 0 times per week.  Today's visit was #: 4 Starting weight: 220 lbs Starting date: 01/04/2020 Today's weight: 216 lbs Today's date: 02/28/2020 Total lbs lost to date: 4 Total lbs lost since last in-office visit: 0  Interim History: Marissa Lin is up 2 lbs since her last visit. She is drinking regular sodas. I prescribed Saxenda, but insurance will not pay for prescription.  Subjective:   Vitamin D deficiency. [name] is taking Vitamin D supplementation.    Ref. Range 01/04/2020 11:30  Vitamin D, 25-Hydroxy Latest Ref Range: 30.0 - 100.0 ng/mL 17.1 (L)   Prediabetes. Marissa Lin has a diagnosis of prediabetes based on her elevated HgA1c and was informed this puts her at greater risk of developing diabetes. She continues to work on diet and exercise to decrease her risk of diabetes. She denies nausea or hypoglycemia. Marissa Lin is on no medication.  Lab Results  Component Value Date   HGBA1C 5.9 (H) 01/04/2020   Lab Results  Component Value Date   INSULIN 12.4 01/04/2020   At risk for hypoglycemia. Marissa Lin is at increased risk for hypoglycemia due to changes in diet, diagnosis of diabetes, and/or insulin use.   Assessment/Plan:   Vitamin D deficiency. Low Vitamin D level contributes to fatigue and are associated with obesity, breast, and colon cancer. She was given a prescription for Vitamin D, Ergocalciferol, (DRISDOL) 1.25 MG (50000 UNIT) CAPS capsule every week #4 with 0 refills and will follow-up for routine testing of Vitamin D, at least 2-3 times per year to avoid over-replacement.   Prediabetes. [name] will continue to work on weight loss, exercise, and decreasing simple  carbohydrates to help decrease the risk of diabetes. Prescription was given for metFORMIN (GLUCOPHAGE) 500 MG tablet 1 PO daily with breakfast #30 with 0 refills. She will decrease refined carbohydrates and sweets.  At risk for hypoglycemia. Marissa Lin was given approximately 15 minutes of counseling today regarding prevention of hypoglycemia. She was advised of symptoms of hypoglycemia. Marissa Lin was instructed to avoid skipping meals, eat regular protein rich meals and schedule low calorie snacks as needed.   Repetitive spaced learning was employed today to elicit superior memory formation and behavioral change.   Class 2 severe obesity with serious comorbidity and body mass index (BMI) of 38.0 to 38.9 in adult, unspecified obesity type (HCC).  Prescription was given for phentermine 30 MG capsule 1 PO daily #30 with 0 refills.  [name] is currently in the action stage of change. As such, her goal is to continue with weight loss efforts. She has agreed to the Category 1 Plan.    She will eliminate all sugary drinks, will work on mindful eating, will adhere more closely to the plan, will increase her water intake.  Prescription was given for Topamax 25 mg BID #60 with 0 refills.  Exercise goals: All adults should avoid inactivity. Some physical activity is better than none, and adults who participate in any amount of physical activity gain some health benefits.  Behavioral modification strategies: increasing lean protein intake, decreasing simple carbohydrates, increasing vegetables, increasing water intake, decreasing eating out, no skipping meals, meal planning and cooking strategies, keeping healthy foods in the home, dealing with family  or coworker sabotage, travel eating strategies, holiday eating strategies  and celebration eating strategies.  Marissa Lin has agreed to follow-up with our clinic in 3 weeks. She was informed of the importance of frequent follow-up visits to maximize her success with intensive  lifestyle modifications for her multiple health conditions.   Objective:   Blood pressure 138/84, pulse (!) 56, temperature 97.9 F (36.6 C), height 5\' 3"  (1.6 m), weight 216 lb (98 kg), SpO2 99 %. Body mass index is 38.26 kg/m.  General: Cooperative, alert, well developed, in no acute distress. HEENT: Conjunctivae and lids unremarkable. Cardiovascular: Regular rhythm.  Lungs: Normal work of breathing. Neurologic: No focal deficits.   Lab Results  Component Value Date   CREATININE 0.63 01/04/2020   BUN 11 01/04/2020   NA 140 01/04/2020   K 3.8 01/04/2020   CL 102 01/04/2020   CO2 24 01/04/2020   Lab Results  Component Value Date   ALT 14 01/04/2020   AST 16 01/04/2020   ALKPHOS 67 01/04/2020   BILITOT 0.3 01/04/2020   Lab Results  Component Value Date   HGBA1C 5.9 (H) 01/04/2020   Lab Results  Component Value Date   INSULIN 12.4 01/04/2020   Lab Results  Component Value Date   TSH 2.610 01/04/2020   Lab Results  Component Value Date   CHOL 231 (H) 01/04/2020   HDL 50 01/04/2020   LDLCALC 172 (H) 01/04/2020   TRIG 55 01/04/2020   Lab Results  Component Value Date   WBC 5.5 01/04/2020   HGB 11.4 01/04/2020   HCT 35.7 01/04/2020   MCV 82 01/04/2020   PLT 363 01/04/2020   No results found for: IRON, TIBC, FERRITIN  Attestation Statements:   Reviewed by clinician on day of visit: allergies, medications, problem list, medical history, surgical history, family history, social history, and previous encounter notes.  01/06/2020, am acting as Fernanda Drum for Energy manager, DO   I have reviewed the above documentation for accuracy and completeness, and I agree with the above. [employer], DO

## 2020-03-04 ENCOUNTER — Emergency Department (HOSPITAL_BASED_OUTPATIENT_CLINIC_OR_DEPARTMENT_OTHER)
Admission: EM | Admit: 2020-03-04 | Discharge: 2020-03-04 | Disposition: A | Discharge status: Home / Self Care | Payer: Managed Care, Other (non HMO) | Attending: Emergency Medicine | Admitting: Emergency Medicine

## 2020-03-04 ENCOUNTER — Other Ambulatory Visit: Payer: Self-pay

## 2020-03-04 ENCOUNTER — Emergency Department (HOSPITAL_BASED_OUTPATIENT_CLINIC_OR_DEPARTMENT_OTHER): Payer: Managed Care, Other (non HMO)

## 2020-03-04 ENCOUNTER — Encounter (HOSPITAL_BASED_OUTPATIENT_CLINIC_OR_DEPARTMENT_OTHER): Payer: Self-pay | Admitting: Emergency Medicine

## 2020-03-04 DIAGNOSIS — M79604 Pain in right leg: Secondary | ICD-10-CM | POA: Insufficient documentation

## 2020-03-04 DIAGNOSIS — M79602 Pain in left arm: Secondary | ICD-10-CM | POA: Diagnosis not present

## 2020-03-04 DIAGNOSIS — M545 Low back pain, unspecified: Secondary | ICD-10-CM | POA: Diagnosis not present

## 2020-03-04 DIAGNOSIS — M542 Cervicalgia: Secondary | ICD-10-CM | POA: Diagnosis not present

## 2020-03-04 DIAGNOSIS — R0789 Other chest pain: Secondary | ICD-10-CM | POA: Diagnosis not present

## 2020-03-04 DIAGNOSIS — R079 Chest pain, unspecified: Secondary | ICD-10-CM

## 2020-03-04 LAB — CBC
HCT: 37.7 % (ref 36.0–46.0)
Hemoglobin: 12.1 g/dL (ref 12.0–15.0)
MCH: 26.9 pg (ref 26.0–34.0)
MCHC: 32.1 g/dL (ref 30.0–36.0)
MCV: 84 fL (ref 80.0–100.0)
Platelets: 375 10*3/uL (ref 150–400)
RBC: 4.49 MIL/uL (ref 3.87–5.11)
RDW: 14.6 % (ref 11.5–15.5)
WBC: 6.5 10*3/uL (ref 4.0–10.5)
nRBC: 0 % (ref 0.0–0.2)

## 2020-03-04 LAB — BASIC METABOLIC PANEL
Anion gap: 10 (ref 5–15)
BUN: 14 mg/dL (ref 6–20)
CO2: 23 mmol/L (ref 22–32)
Calcium: 8.7 mg/dL — ABNORMAL LOW (ref 8.9–10.3)
Chloride: 102 mmol/L (ref 98–111)
Creatinine, Ser: 0.74 mg/dL (ref 0.44–1.00)
GFR, Estimated: >60 mL/min (ref >60)
Glucose, Bld: 93 mg/dL (ref 70–99)
Potassium: 3.6 mmol/L (ref 3.5–5.1)
Sodium: 135 mmol/L (ref 135–145)

## 2020-03-04 LAB — TROPONIN I (HIGH SENSITIVITY): Troponin I (High Sensitivity): 3 ng/L (ref <18)

## 2020-03-04 NOTE — ED Triage Notes (Signed)
Pt complaint of pain in back of neck, right leg pain X1 week "feels like tightness, left arm X1 day, eyes are dry and painful, chest pain X 2 days. Just started Topamax and Phentermine on wed.

## 2020-03-04 NOTE — ED Provider Notes (Signed)
[facility] Emergency Department Provider Note MRN:  [id number]  Arrival date & time: 03/04/20     Chief Complaint   Leg Pain and Chest Pain   History of Present Illness   Marissa Lin is a 49 y.o. year-old female with no pertinent past medical history presenting to the ED with chief complaint of leg pain and chest pain.  Pain in the right leg for the past few days, described as somebody pouring hot water on the right knee.  No redness or swelling.  No trauma.  This morning with a discomfort in her chest and left arm.  Also with discomfort of the right neck and right lower back.  Also with her vision for 1 or 2 weeks.  Multiple complaints.  Review of Systems  A complete 10 system review of systems was obtained and all systems are negative except as noted in the HPI and PMH.   Patient's Health History    Past Medical History:  Diagnosis Date  . Anxiety   . Arthritis   . Back pain   . Carpal tunnel syndrome   . Cold feeling   . Constipation   . GERD (gastroesophageal reflux disease)   . High cholesterol   . Metatarsal deformity   . Migraine   . Nausea   . Palpitation   . Plantar fasciitis   . Swelling    feet and legs  . Vertigo     Past Surgical History:  Procedure Laterality Date  . ABDOMINAL HYSTERECTOMY    . CESAREAN SECTION      Family History  Problem Relation Age of Onset  . High blood pressure Mother   . Drug abuse Mother   . Obesity Mother   . Diabetes Father   . Kidney disease Father   . Obesity Father     Social History   Socioeconomic History  . Marital status: Legally Separated    Spouse name: Not on file  . Number of children: Not on file  . Years of education: Not on file  . Highest education level: Not on file  Occupational History  . Occupation: Transit bus driver  Tobacco Use  . Smoking status: Never Smoker  . Smokeless tobacco: Never Used  Substance and Sexual Activity  . Alcohol use: No     Alcohol/week: 0.0 standard drinks  . Drug use: No  . Sexual activity: Not on file  Other Topics Concern  . Not on file  Social History Narrative  . Not on file   Social Determinants of Health   Financial Resource Strain:   . Difficulty of Paying Living Expenses: Not on file  Food Insecurity:   . Worried About Programme researcher, broadcasting/film/video in the Last Year: Not on file  . Ran Out of Food in the Last Year: Not on file  Transportation Needs:   . Lack of Transportation (Medical): Not on file  . Lack of Transportation (Non-Medical): Not on file  Physical Activity:   . Days of Exercise per Week: Not on file  . Minutes of Exercise per Session: Not on file  Stress:   . Feeling of Stress : Not on file  Social Connections:   . Frequency of Communication with Friends and Family: Not on file  . Frequency of Social Gatherings with Friends and Family: Not on file  . Attends Religious Services: Not on file  . Active Member of Clubs or Organizations: Not on file  . Attends Banker  Meetings: Not on file  . Marital Status: Not on file  Intimate Partner Violence:   . Fear of Current or Ex-Partner: Not on file  . Emotionally Abused: Not on file  . Physically Abused: Not on file  . Sexually Abused: Not on file     Physical Exam   Vitals:   03/04/20 0556  BP: 134/81  Pulse: 87  Resp: 20  Temp: 98.1 F (36.7 C)  SpO2: 100%    CONSTITUTIONAL: Well-appearing, NAD NEURO:  Alert and oriented x 3, no focal deficits EYES:  eyes equal and reactive ENT/NECK:  no LAD, no JVD CARDIO: Regular rate, well-perfused, normal S1 and S2 PULM:  CTAB no wheezing or rhonchi GI/GU:  normal bowel sounds, non-distended, non-tender MSK/SPINE:  No gross deformities, no edema SKIN:  no rash, atraumatic PSYCH:  Appropriate speech and behavior  *Additional and/or pertinent findings included in MDM below  Diagnostic and Interventional Summary    EKG Interpretation  Date/Time:  Sunday March 04 2020  07:02:59 EST Ventricular Rate:  73 PR Interval:    QRS Duration: 114 QT Interval:  465 QTC Calculation: 516 R Axis:   11 Text Interpretation: Atrial flutter Ventricular premature complex Aberrant conduction of SV complex(es) Borderline intraventricular conduction delay Low voltage, precordial leads Nonspecific T abnormalities, anterior leads Prolonged QT interval Confirmed by Gerlene Fee [phone]) on 03/04/2020 7:18:35 AM      Labs Reviewed  CBC  BASIC METABOLIC PANEL  TROPONIN I (HIGH SENSITIVITY)    DG Chest Port 1 View  Final Result    US Venous Img Lower Unilateral Right    (Results Pending)    Medications - No data to display   Procedures  /  Critical Care Procedures  ED Course and Medical Decision Making  I have reviewed the triage vital signs, the nursing notes, and pertinent available records from the EMR.  Listed above are laboratory and imaging tests that I personally ordered, reviewed, and interpreted and then considered in my medical decision making (see below for details).  Myriad of symptoms but her greatest concern would be her right leg pain.  She has a strong family history of DVT and she is concerned about this diagnosis.  She is also endorsing a very atypical chest discomfort that she has a hard time describing.  Does not seem cardiac in etiology.  Still given her age will screen with EKG and troponin.  I have very low concern for PE at this time.  However with a positive DVT ultrasound, would consider CTA.  Otherwise with a negative ultrasound, negative troponin, patient would be appropriate for discharge with reassurance.  Signed out to oncoming provider at shift change.       Barth Kirks. [place] Small, [place] [email or web address]  Final Clinical Impressions(s) / ED Diagnoses     ICD-10-CM   1. Chest pain, unspecified type  R07.9   2. Pain of right lower extremity  M79.604     ED Discharge Orders    None        Discharge Instructions Discussed with and Provided to Patient:   Discharge Instructions   None       Maudie Flakes, MD 03/04/20 [phone]

## 2020-03-04 NOTE — Discharge Instructions (Signed)
We saw you in the ER for the chest pain/shortness of breath. All of our cardiac workup is normal, including labs, EKG and chest X-RAY are normal. We are not sure what is causing your discomfort, but we feel comfortable sending you home at this time. The workup in the ER is not complete, and you should follow up with your primary care doctor for further evaluation.  

## 2020-03-04 NOTE — ED Provider Notes (Signed)
  Physical Exam  BP 134/81 (BP Location: Right Arm)   Pulse 87   Temp 98.1 F (36.7 C) (Oral)   Resp 20   Ht 5' (1.524 m)   Wt 117.9 kg   SpO2 100%   BMI 50.78 kg/m   Physical Exam  ED Course/Procedures     Procedures  MDM   Assuming care of patient from Dr. Pilar Plate.   Patient in the ED for leg pain, shortness of breath. Workup thus far shows neg DVT study.  Concerning findings are as following  Important pending results are labs.  According to Dr. Pilar Plate, plan is to get delta troponin.  Delta troponin are negative patient will be discharged.  Patient had no complains, no concerns from the nursing side. Will continue to monitor.      Derwood Kaplan, MD 03/04/20 [phone]

## 2020-03-05 ENCOUNTER — Encounter (INDEPENDENT_AMBULATORY_CARE_PROVIDER_SITE_OTHER): Payer: Self-pay | Admitting: Bariatrics

## 2020-03-16 DIAGNOSIS — M7989 Other specified soft tissue disorders: Secondary | ICD-10-CM

## 2020-03-16 DIAGNOSIS — F321 Major depressive disorder, single episode, moderate: Secondary | ICD-10-CM

## 2020-03-16 DIAGNOSIS — F411 Generalized anxiety disorder: Secondary | ICD-10-CM

## 2020-03-16 DIAGNOSIS — R0602 Shortness of breath: Secondary | ICD-10-CM

## 2020-03-16 HISTORY — DX: Other specified soft tissue disorders: M79.89

## 2020-03-16 HISTORY — DX: Generalized anxiety disorder: F41.1

## 2020-03-16 HISTORY — DX: Shortness of breath: R06.02

## 2020-03-16 HISTORY — DX: Major depressive disorder, single episode, moderate: F32.1

## 2020-03-21 ENCOUNTER — Ambulatory Visit (INDEPENDENT_AMBULATORY_CARE_PROVIDER_SITE_OTHER): Payer: Managed Care, Other (non HMO) | Admitting: Bariatrics

## 2020-04-03 IMAGING — CR DG KNEE COMPLETE 4+V*R*
4 series · 4 of 4 positions shown · non-contrast
Comparison: None.

CLINICAL DATA: Acute RIGHT knee pain. No known injury. Initial
encounter.

EXAM:
RIGHT KNEE - COMPLETE 4+ VIEW

[t knee ap right]
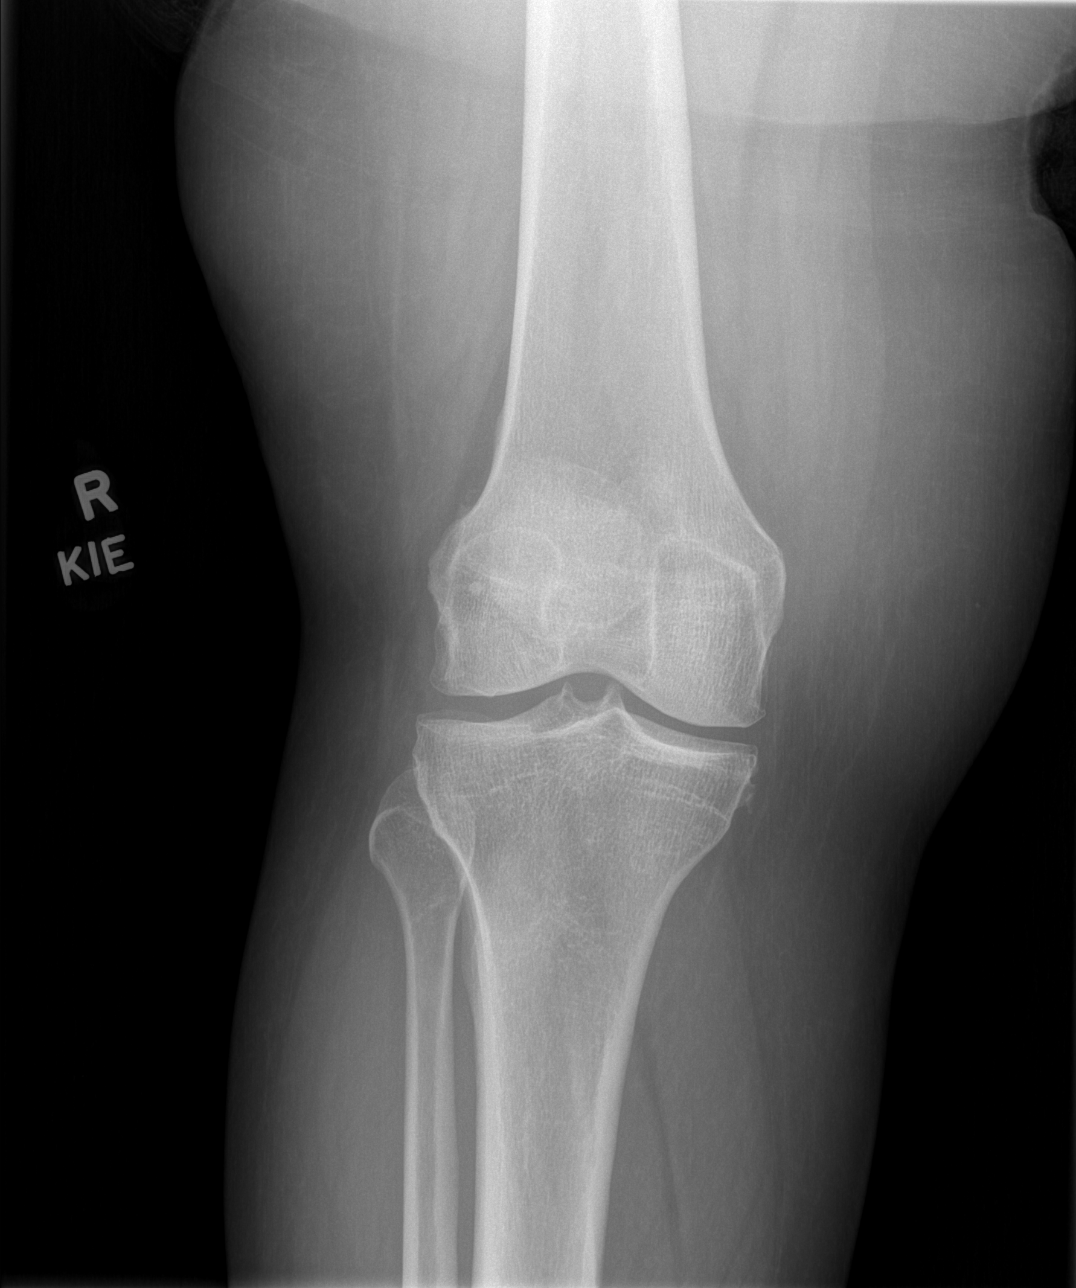

[t knee oblique right (1 of 2)]
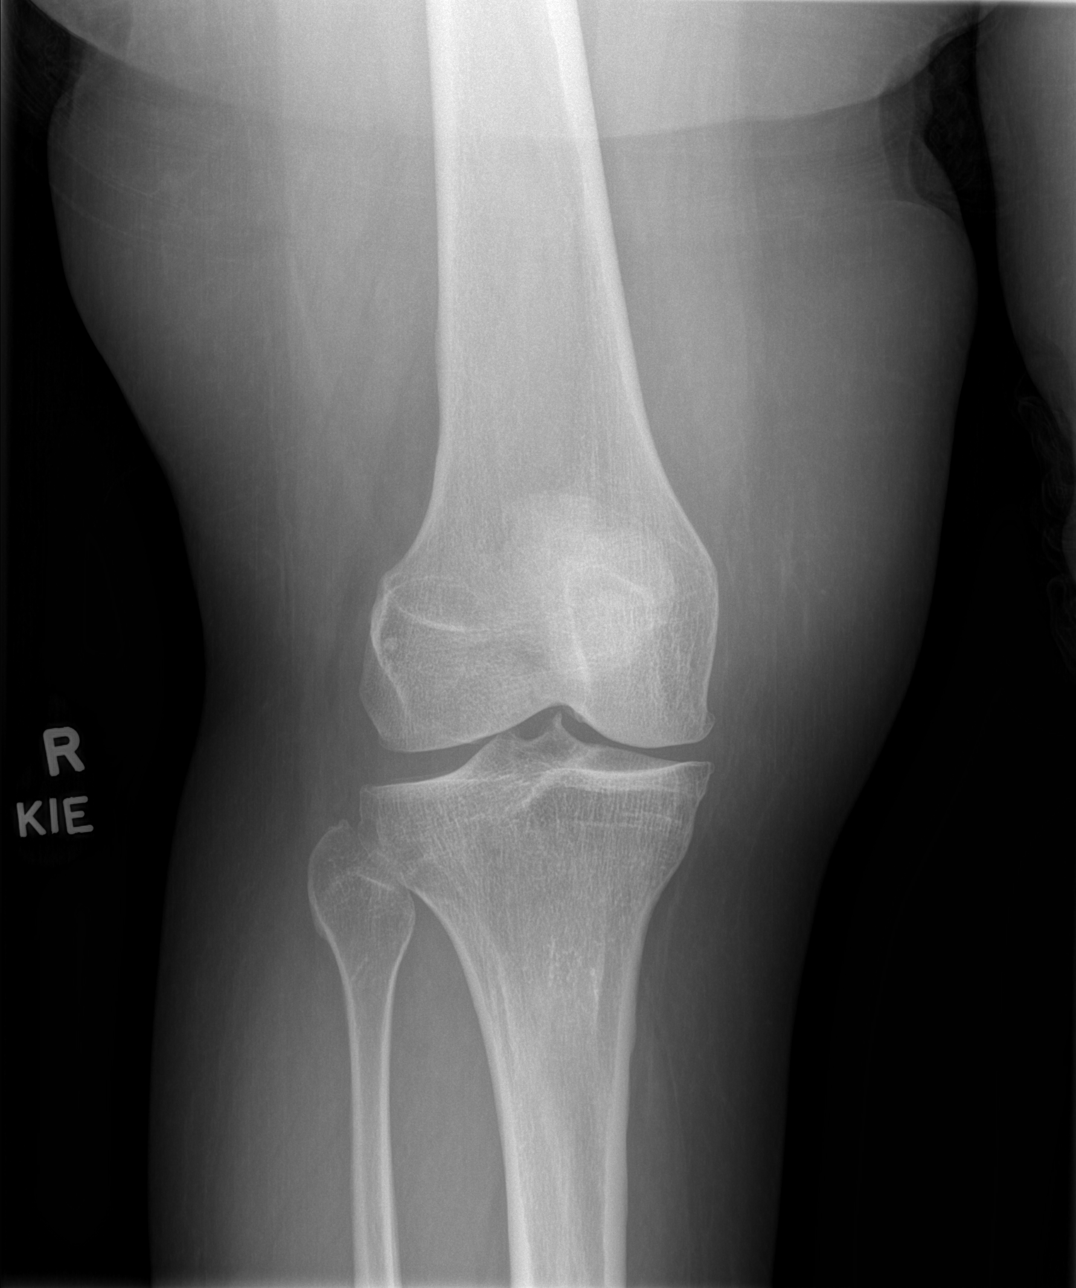

[t knee oblique right (2 of 2)]
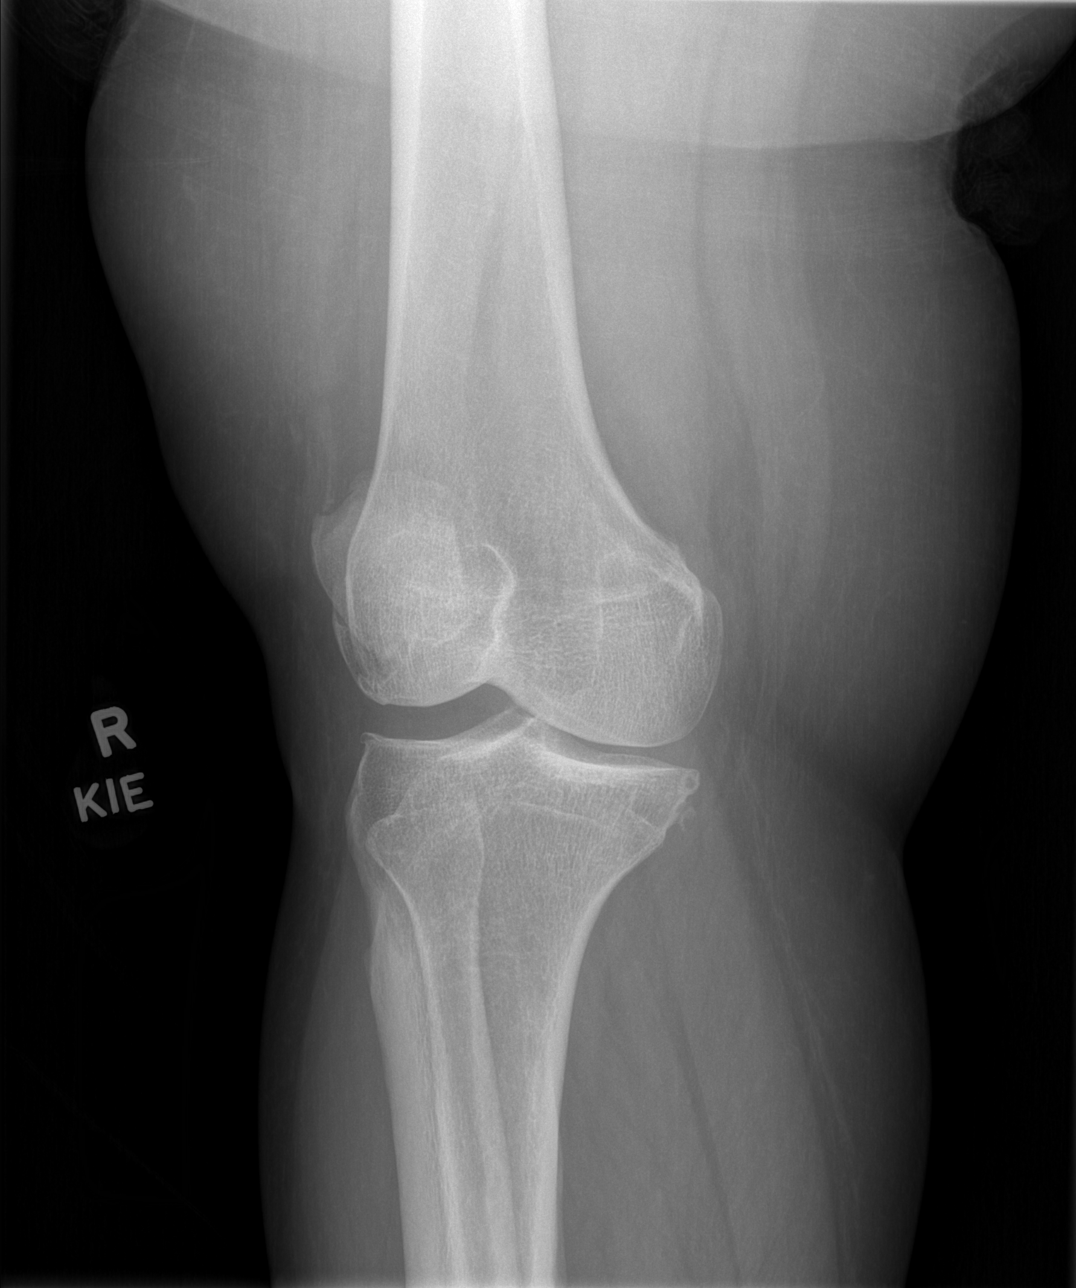

[t knee lat right]
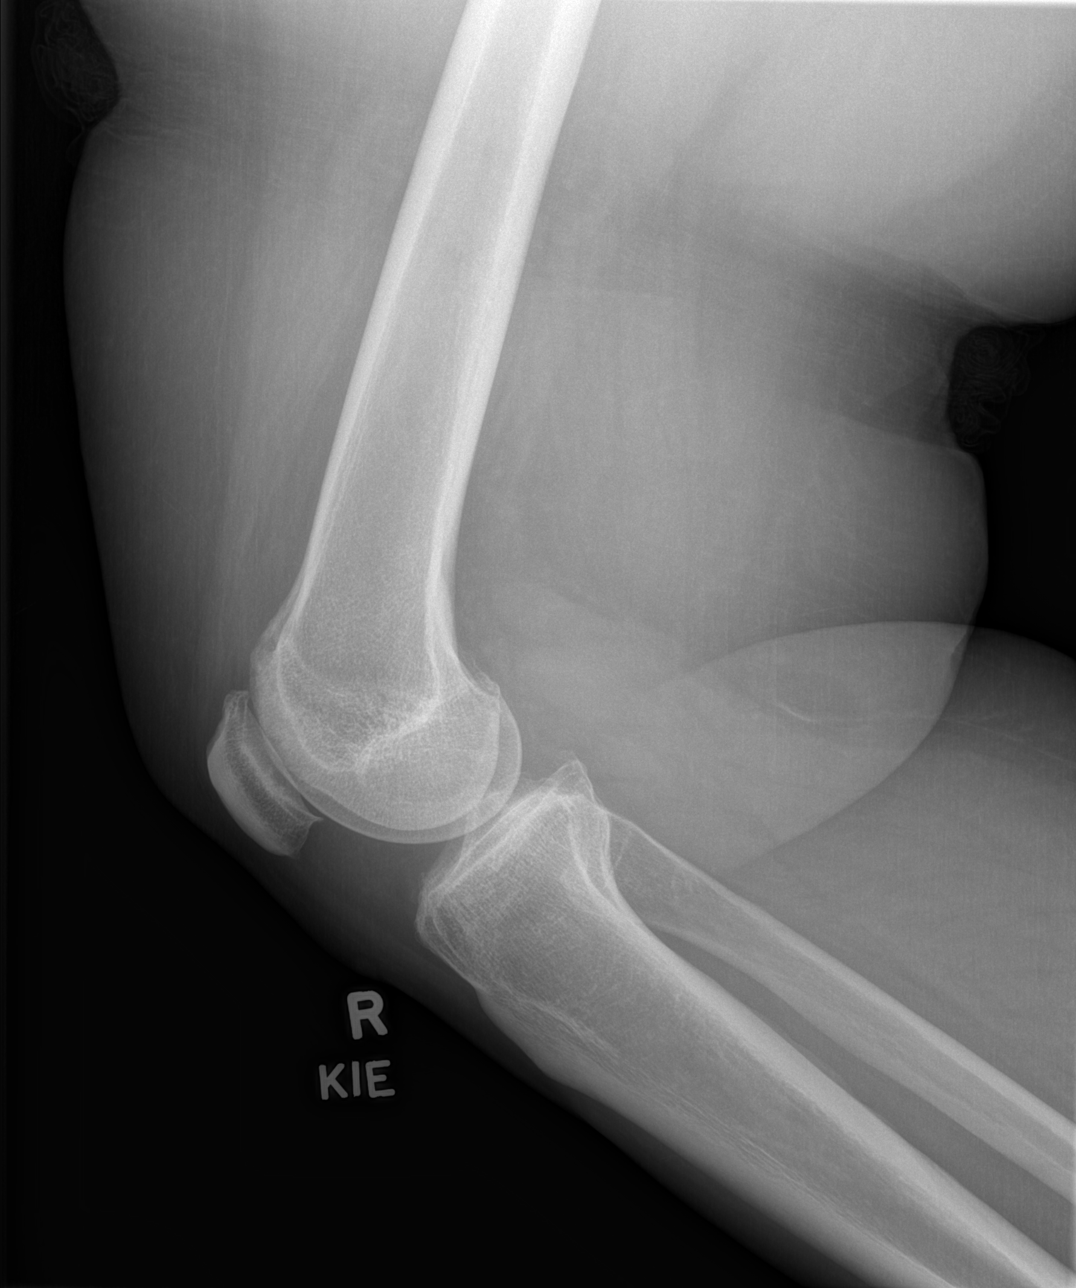

[4 of 4 positions shown; findings below may reference images not displayed]

FINDINGS: No acute fracture, subluxation or dislocation.

No joint effusion.

Mild tricompartmental degenerative changes, greatest in the
patellofemoral compartment.

No suspicious focal bony lesions identified.
IMPRESSION: 1. No acute abnormality
2. Mild tricompartmental degenerative changes.

## 2020-04-25 ENCOUNTER — Ambulatory Visit (INDEPENDENT_AMBULATORY_CARE_PROVIDER_SITE_OTHER): Payer: Managed Care, Other (non HMO) | Admitting: Bariatrics

## 2020-05-11 DIAGNOSIS — R072 Precordial pain: Secondary | ICD-10-CM | POA: Insufficient documentation

## 2020-05-11 HISTORY — DX: Precordial pain: R07.2

## 2020-05-13 IMAGING — US US EXTREM LOW VENOUS*R*
1 series · 14 of 24 positions shown · non-contrast
Comparison: None

CLINICAL DATA: Ankle swelling, pain x1 month

EXAM:
RIGHT LOWER EXTREMITY VENOUS DOPPLER ULTRASOUND
TECHNIQUE: Gray-scale sonography with compression, as well as color and duplex
ultrasound, were performed to evaluate the deep venous system from
the level of the common femoral vein through the popliteal and
proximal calf veins.

[Series 1: us extrem low venous*right* · 0.08mm/px · 14 of 35 slices shown]
[im 1/35]
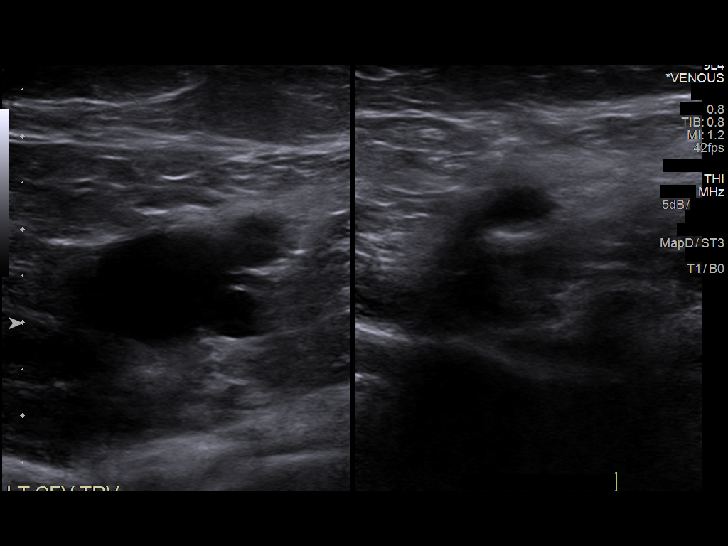
[im 3/35]
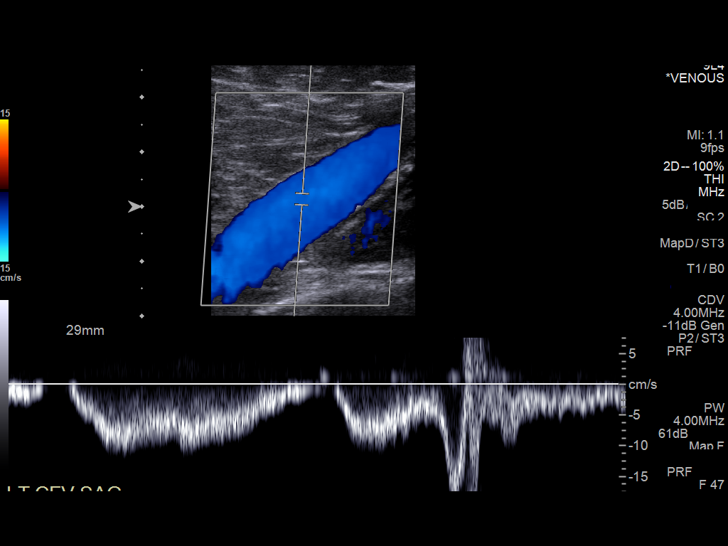
[im 6/35]
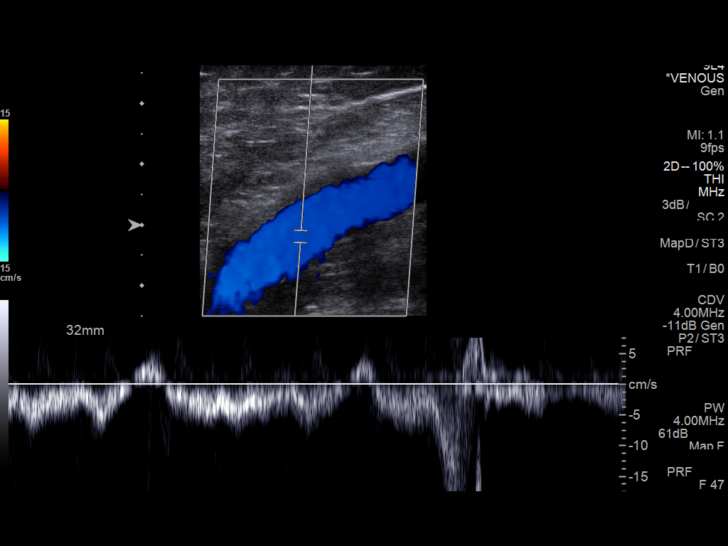
[im 9/35]
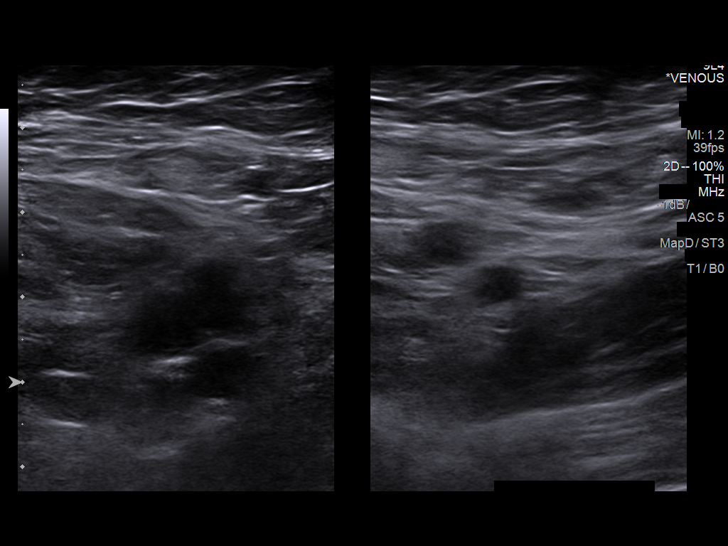
[im 11/35]
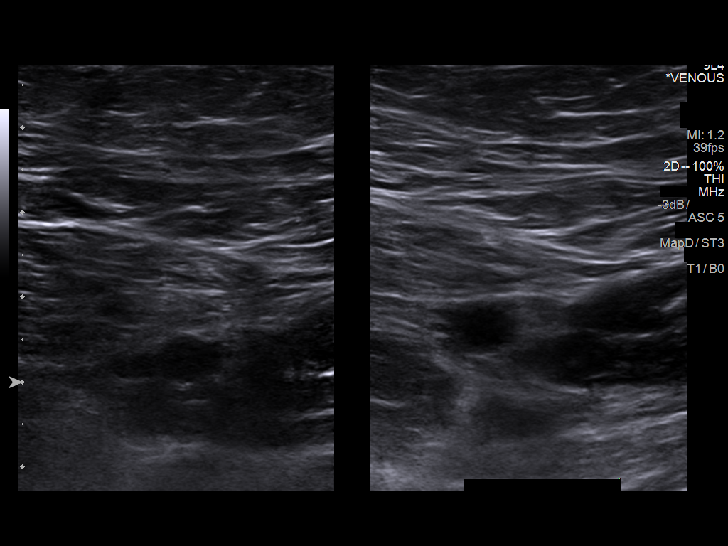
[im 14/35]
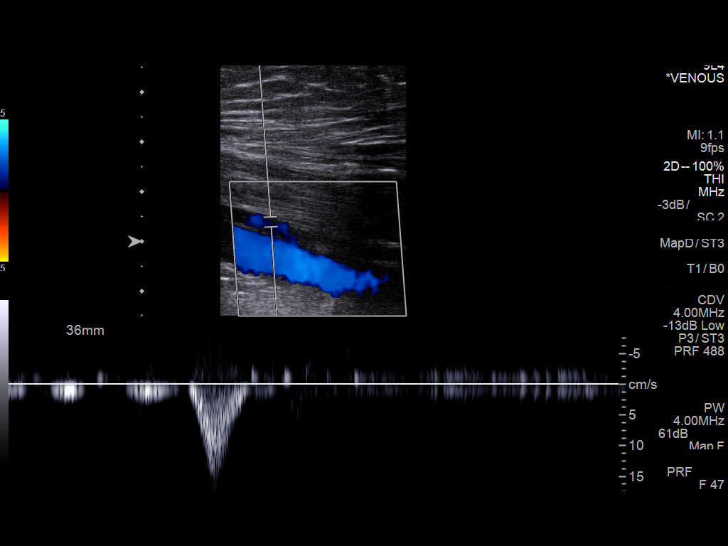
[im 17/35]
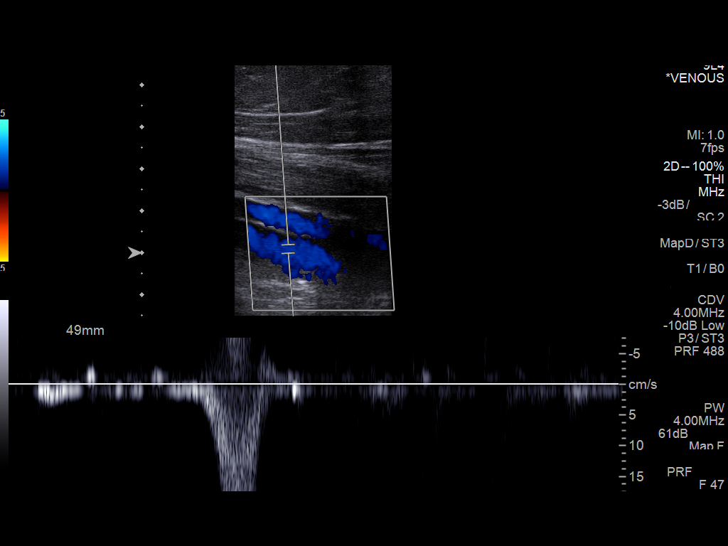
[im 18/35]
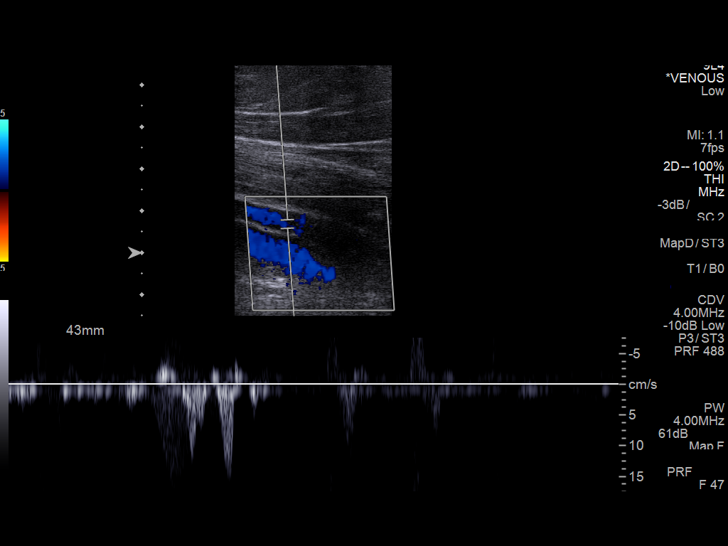
[im 21/35]
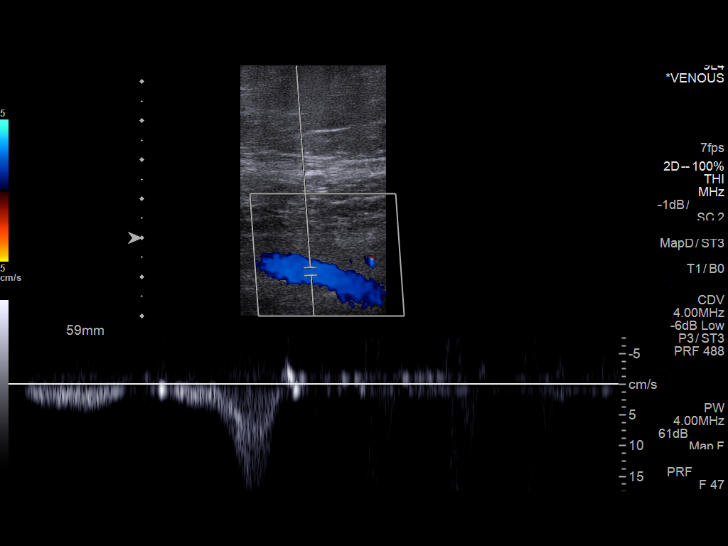
[im 24/35]
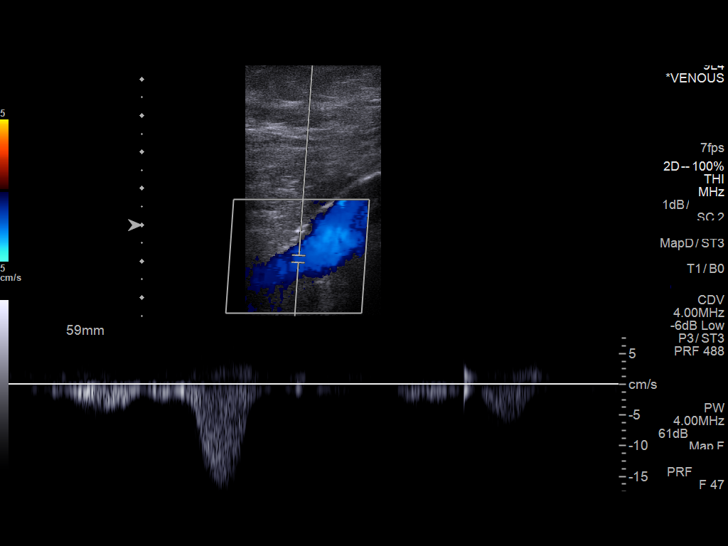
[im 27/35]
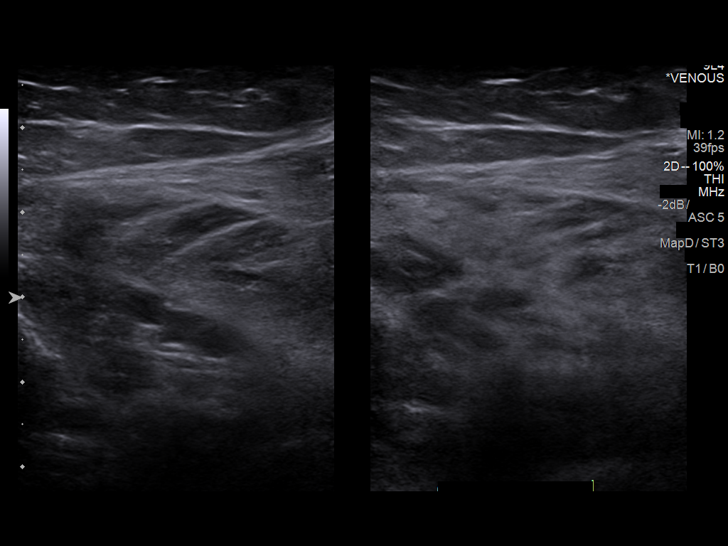
[im 29/35]
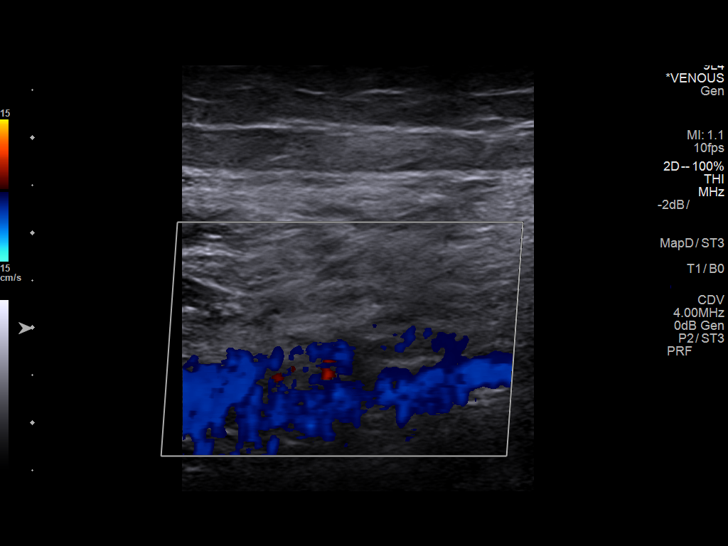
[im 32/35]
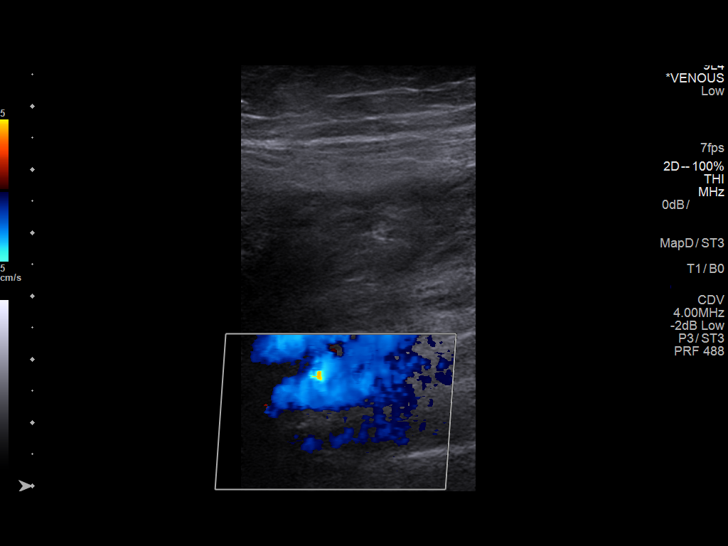
[im 35/35]
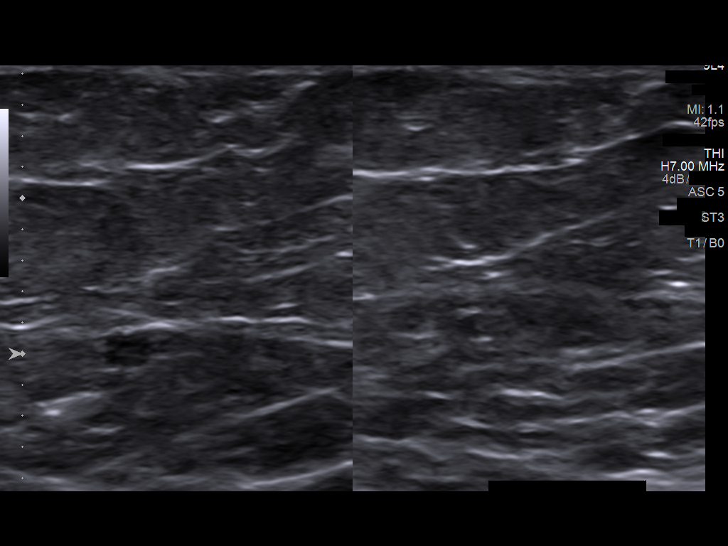

[14 of 24 positions shown; findings below may reference images not displayed]

FINDINGS: Normal compressibility of the common femoral, superficial femoral,
and popliteal veins, as well as the proximal calf veins. No filling
defects to suggest DVT on grayscale or color Doppler imaging.
Doppler waveforms show normal direction of venous flow, normal
respiratory phasicity and response to augmentation. Visualized
segments of the saphenous venous system normal in caliber and
compressibility. Survey views of the contralateral common femoral
vein are unremarkable.
IMPRESSION: No evidence of right lower extremity deep vein thrombosis.

## 2020-11-15 DIAGNOSIS — M25561 Pain in right knee: Secondary | ICD-10-CM | POA: Insufficient documentation

## 2020-11-15 HISTORY — DX: Pain in right knee: M25.561

## 2020-12-17 DIAGNOSIS — T148XXA Other injury of unspecified body region, initial encounter: Secondary | ICD-10-CM | POA: Insufficient documentation

## 2021-04-28 DIAGNOSIS — I10 Essential (primary) hypertension: Secondary | ICD-10-CM | POA: Insufficient documentation

## 2021-04-28 DIAGNOSIS — E119 Type 2 diabetes mellitus without complications: Secondary | ICD-10-CM | POA: Insufficient documentation

## 2021-05-16 DIAGNOSIS — W19XXXA Unspecified fall, initial encounter: Secondary | ICD-10-CM

## 2021-05-16 HISTORY — DX: Unspecified fall, initial encounter: W19.XXXA

## 2021-09-11 DIAGNOSIS — R21 Rash and other nonspecific skin eruption: Secondary | ICD-10-CM | POA: Insufficient documentation

## 2021-10-18 DIAGNOSIS — S8001XA Contusion of right knee, initial encounter: Secondary | ICD-10-CM | POA: Insufficient documentation

## 2021-10-18 HISTORY — DX: Contusion of right knee, initial encounter: S80.01XA

## 2021-11-13 ENCOUNTER — Encounter (INDEPENDENT_AMBULATORY_CARE_PROVIDER_SITE_OTHER): Payer: Self-pay

## 2021-11-28 ENCOUNTER — Emergency Department (HOSPITAL_BASED_OUTPATIENT_CLINIC_OR_DEPARTMENT_OTHER): Payer: Managed Care, Other (non HMO)

## 2021-11-28 ENCOUNTER — Emergency Department (HOSPITAL_BASED_OUTPATIENT_CLINIC_OR_DEPARTMENT_OTHER)
Admission: EM | Admit: 2021-11-28 | Discharge: 2021-11-28 | Disposition: A | Discharge status: Home / Self Care | Payer: Managed Care, Other (non HMO) | Attending: Emergency Medicine | Admitting: Emergency Medicine

## 2021-11-28 ENCOUNTER — Other Ambulatory Visit: Payer: Self-pay

## 2021-11-28 ENCOUNTER — Encounter (HOSPITAL_BASED_OUTPATIENT_CLINIC_OR_DEPARTMENT_OTHER): Payer: Self-pay | Admitting: Pediatrics

## 2021-11-28 DIAGNOSIS — Z7984 Long term (current) use of oral hypoglycemic drugs: Secondary | ICD-10-CM | POA: Diagnosis not present

## 2021-11-28 DIAGNOSIS — R0789 Other chest pain: Secondary | ICD-10-CM | POA: Diagnosis not present

## 2021-11-28 DIAGNOSIS — R079 Chest pain, unspecified: Secondary | ICD-10-CM | POA: Diagnosis present

## 2021-11-28 DIAGNOSIS — Z20822 Contact with and (suspected) exposure to covid-19: Secondary | ICD-10-CM | POA: Insufficient documentation

## 2021-11-28 DIAGNOSIS — E119 Type 2 diabetes mellitus without complications: Secondary | ICD-10-CM | POA: Insufficient documentation

## 2021-11-28 DIAGNOSIS — Z794 Long term (current) use of insulin: Secondary | ICD-10-CM | POA: Diagnosis not present

## 2021-11-28 DIAGNOSIS — R0602 Shortness of breath: Secondary | ICD-10-CM | POA: Diagnosis not present

## 2021-11-28 DIAGNOSIS — R5383 Other fatigue: Secondary | ICD-10-CM | POA: Insufficient documentation

## 2021-11-28 DIAGNOSIS — R0600 Dyspnea, unspecified: Secondary | ICD-10-CM | POA: Diagnosis not present

## 2021-11-28 LAB — BRAIN NATRIURETIC PEPTIDE: B Natriuretic Peptide: 25.3 pg/mL (ref 0.0–100.0)

## 2021-11-28 LAB — CBC WITH DIFFERENTIAL/PLATELET
Abs Immature Granulocytes: 0.02 10*3/uL (ref 0.00–0.07)
Basophils Absolute: 0.1 10*3/uL (ref 0.0–0.1)
Basophils Relative: 1 %
Eosinophils Absolute: 0.2 10*3/uL (ref 0.0–0.5)
Eosinophils Relative: 5 %
HCT: 35.2 % — ABNORMAL LOW (ref 36.0–46.0)
Hemoglobin: 11.1 g/dL — ABNORMAL LOW (ref 12.0–15.0)
Immature Granulocytes: 0 %
Lymphocytes Relative: 28 %
Lymphs Abs: 1.3 10*3/uL (ref 0.7–4.0)
MCH: 25.9 pg — ABNORMAL LOW (ref 26.0–34.0)
MCHC: 31.5 g/dL (ref 30.0–36.0)
MCV: 82.1 fL (ref 80.0–100.0)
Monocytes Absolute: 0.5 10*3/uL (ref 0.1–1.0)
Monocytes Relative: 10 %
Neutro Abs: 2.6 10*3/uL (ref 1.7–7.7)
Neutrophils Relative %: 56 %
Platelets: 321 10*3/uL (ref 150–400)
RBC: 4.29 MIL/uL (ref 3.87–5.11)
RDW: 16 % — ABNORMAL HIGH (ref 11.5–15.5)
WBC: 4.6 10*3/uL (ref 4.0–10.5)
nRBC: 0 % (ref 0.0–0.2)

## 2021-11-28 LAB — COMPREHENSIVE METABOLIC PANEL
ALT: 15 U/L (ref 0–44)
AST: 17 U/L (ref 15–41)
Albumin: 4 g/dL (ref 3.5–5.0)
Alkaline Phosphatase: 62 U/L (ref 38–126)
Anion gap: 9 (ref 5–15)
BUN: 16 mg/dL (ref 6–20)
CO2: 23 mmol/L (ref 22–32)
Calcium: 9.1 mg/dL (ref 8.9–10.3)
Chloride: 106 mmol/L (ref 98–111)
Creatinine, Ser: 0.78 mg/dL (ref 0.44–1.00)
GFR, Estimated: >60 mL/min (ref >60)
Glucose, Bld: 106 mg/dL — ABNORMAL HIGH (ref 70–99)
Potassium: 3.5 mmol/L (ref 3.5–5.1)
Sodium: 138 mmol/L (ref 135–145)
Total Bilirubin: 0.5 mg/dL (ref 0.3–1.2)
Total Protein: 7.2 g/dL (ref 6.5–8.1)

## 2021-11-28 LAB — URINALYSIS, ROUTINE W REFLEX MICROSCOPIC
Bilirubin Urine: NEGATIVE
Glucose, UA: NEGATIVE mg/dL
Hgb urine dipstick: NEGATIVE
Ketones, ur: NEGATIVE mg/dL
Leukocytes,Ua: NEGATIVE
Nitrite: NEGATIVE
Protein, ur: NEGATIVE mg/dL
Specific Gravity, Urine: 1.015 (ref 1.005–1.030)
pH: 6.5 (ref 5.0–8.0)

## 2021-11-28 LAB — RESP PANEL BY RT-PCR (FLU A&B, COVID) ARPGX2
Influenza A by PCR: NEGATIVE
Influenza B by PCR: NEGATIVE
SARS Coronavirus 2 by RT PCR: NEGATIVE

## 2021-11-28 LAB — TROPONIN I (HIGH SENSITIVITY)
Troponin I (High Sensitivity): 3 ng/L (ref <18)
Troponin I (High Sensitivity): 4 ng/L (ref <18)

## 2021-11-28 LAB — D-DIMER, QUANTITATIVE: D-Dimer, Quant: 0.72 ug/mL-FEU — ABNORMAL HIGH (ref 0.00–0.50)

## 2021-11-28 LAB — LIPASE, BLOOD: Lipase: 27 U/L (ref 11–51)

## 2021-11-28 MED ORDER — ONDANSETRON HCL 4 MG/2ML IJ SOLN
4.0000 mg | Freq: Once | INTRAMUSCULAR | Status: AC
  Administered 2021-11-28: 4 mg via INTRAVENOUS
  Filled 2021-11-28: qty 2

## 2021-11-28 MED ORDER — KETOROLAC TROMETHAMINE 30 MG/ML IJ SOLN
30.0000 mg | Freq: Once | INTRAMUSCULAR | Status: AC
  Administered 2021-11-28: 30 mg via INTRAVENOUS
  Filled 2021-11-28: qty 1

## 2021-11-28 MED ORDER — IOHEXOL 350 MG/ML SOLN
100.0000 mL | Freq: Once | INTRAVENOUS | Status: AC | PRN
  Administered 2021-11-28: 100 mL via INTRAVENOUS

## 2021-11-28 NOTE — ED Provider Notes (Signed)
[facility] EMERGENCY DEPARTMENT Provider Note   CSN: [id number] Arrival date & time: 11/28/21  3976     History  Chief Complaint  Patient presents with   Chest Pain   Back Pain   Ear Fullness    Marissa Lin is a 51 y.o. female.  With PMH of HLD, DM, arthritis, GERD, back pain presenting with multiple medical complaints.  However, her main complaint is chest tightness like a "knot" associated with shortness of breath and generally feeling unwell.  Patient notes feeling generally unwell for the past 4 to 5 days but what made her come in today was new chest tightness in the substernal area that was worse with deep breathing.  She notes associated shortness of breath but no nausea, no vomiting, no diaphoresis.  No history of MI, CVA or PE or DVT.  She also notes having recent rhinorrhea and congestion with pressure in her right ear.  She has had no fevers, cough, urinary symptoms.  She was recently visiting a family member in the hospital who is dealing with blood clots.  No leg pain or swelling, no recent travel, no recent surgery.   Chest Pain Associated symptoms: back pain   Back Pain Associated symptoms: chest pain   Ear Fullness Associated symptoms include chest pain.       Home Medications Prior to Admission medications   Medication Sig Start Date End Date Taking? Authorizing Provider  dicyclomine (BENTYL) 20 MG tablet Take 1 tablet (20 mg total) by mouth 4 (four) times daily as needed for spasms. 11/24/19   Molpus, John, MD  Insulin Pen Needle 32G X 4 MM MISC Use daily with Saxenda 02/13/20   Corinna Capra A, DO  metFORMIN (GLUCOPHAGE) 500 MG tablet Take 1 tablet (500 mg total) by mouth daily with breakfast. 02/28/20   Corinna Capra A, DO  montelukast (SINGULAIR) 10 MG tablet Take 10 mg by mouth at bedtime.    [provider]  ondansetron (ZOFRAN ODT) 8 MG disintegrating tablet Take 1 tablet (8 mg total) by mouth every 8 (eight) hours as needed for  nausea or vomiting. 11/24/19   Molpus, John, MD  phentermine 30 MG capsule Take 1 capsule (30 mg total) by mouth every morning. 02/28/20   Corinna Capra A, DO  topiramate (TOPAMAX) 25 MG tablet Take 1 tablet (25 mg total) by mouth 2 (two) times daily. 02/28/20   Corinna Capra A, DO  Vitamin D, Ergocalciferol, (DRISDOL) 1.25 MG (50000 UNIT) CAPS capsule Take 1 capsule (50,000 Units total) by mouth every 7 (seven) days. 02/28/20   Corinna Capra A, DO  cetirizine (ZYRTEC) 10 MG tablet Take 1 tablet (10 mg total) by mouth daily. 02/25/17 08/04/19  Maczis, Elmer Sow, PA-C  fluticasone (FLONASE) 50 MCG/ACT nasal spray Place 2 sprays into both nostrils daily. 02/25/17 08/04/19  Maczis, Elmer Sow, PA-C      Allergies    Nitrofurantoin, Other, Diflucan [fluconazole], and Pineapple    Review of Systems   Review of Systems  Cardiovascular:  Positive for chest pain.  Musculoskeletal:  Positive for back pain.    Physical Exam Updated Vital Signs BP 117/68   Pulse 75   Temp 98 F (36.7 C) (Oral)   Resp 20   Ht 5\' 1"  (1.549 m)   Wt 104.3 kg   SpO2 100%   BMI 43.46 kg/m  Physical Exam Constitutional: Alert and oriented. Well appearing and in no distress. Eyes: Conjunctivae are normal. ENT  Head: Normocephalic and atraumatic.      Nose: No congestion.      Mouth/Throat: Mucous membranes are moist.      Neck: No stridor. Cardiovascular: S1, S2,  Normal and symmetric distal pulses are present in all extremities.Warm and well perfused. Respiratory: Normal respiratory effort. Breath sounds are normal.  O2 sat 100% on room air. Gastrointestinal: Soft and nontender. There is no CVA tenderness. Musculoskeletal: Normal range of motion in all extremities. Trace nontender equal pitting edema extending from the ankle to the knee bilaterally Neurologic: Normal speech and language. No gross focal neurologic deficits are appreciated. Skin: Skin is warm, dry and intact. No rash noted. Psychiatric: Mood and  affect are normal. Speech and behavior are normal.  ED Results / Procedures / Treatments   Labs (all labs ordered are listed, but only abnormal results are displayed) Labs Reviewed  COMPREHENSIVE METABOLIC PANEL - Abnormal; Notable for the following components:      Result Value   Glucose, Bld 106 (*)    All other components within normal limits  CBC WITH DIFFERENTIAL/PLATELET - Abnormal; Notable for the following components:   Hemoglobin 11.1 (*)    HCT 35.2 (*)    MCH 25.9 (*)    RDW 16.0 (*)    All other components within normal limits  D-DIMER, QUANTITATIVE - Abnormal; Notable for the following components:   D-Dimer, Quant 0.72 (*)    All other components within normal limits  RESP PANEL BY RT-PCR (FLU A&B, COVID) ARPGX2  LIPASE, BLOOD  BRAIN NATRIURETIC PEPTIDE  URINALYSIS, ROUTINE W REFLEX MICROSCOPIC  TROPONIN I (HIGH SENSITIVITY)  TROPONIN I (HIGH SENSITIVITY)    EKG EKG Interpretation  Date/Time:  Thursday November 28 2021 07:32:41 EDT Ventricular Rate:  71 PR Interval:  160 QRS Duration: 96 QT Interval:  389 QTC Calculation: 423 R Axis:   8 Text Interpretation: Sinus rhythm Low voltage, precordial leads Borderline T abnormalities, anterior leads Nonspecific T  changes, T inversion V3, T inversion III and avF Similar to previous Confirmed by Vivien Rossetti ([id number]) on 11/28/2021 7:39:56 AM  Radiology CT Angio Chest PE W and/or Wo Contrast  Result Date: 11/28/2021 CLINICAL DATA:  51 year old female with chest pain, shortness of breath, abnormal D-dimer. EXAM: CT ANGIOGRAPHY CHEST WITH CONTRAST TECHNIQUE: Multidetector CT imaging of the chest was performed using the standard protocol during bolus administration of intravenous contrast. Multiplanar CT image reconstructions and MIPs were obtained to evaluate the vascular anatomy. RADIATION DOSE REDUCTION: This exam was performed according to the departmental dose-optimization program which includes automated exposure  control, adjustment of the mA and/or kV according to patient size and/or use of iterative reconstruction technique. CONTRAST:  OMNIPAQUE IOHEXOL 350 MG/ML SOLN COMPARISON:  Chest radiographs 0824 hours today. CT Abdomen and Pelvis 11/24/2019. FINDINGS: Cardiovascular: Suboptimal but adequate contrast bolus timing in the pulmonary arterial tree. No significant respiratory motion. No pulmonary artery filling defect identified. No cardiomegaly or pericardial effusion. No calcified coronary artery atherosclerosis is evident. Negative visible aorta. Mediastinum/Nodes: Negative. No mediastinal mass or lymphadenopathy. Lungs/Pleura: Major airways are patent. Lung volumes appear normal and both lungs are clear. No pleural effusion. Upper Abdomen: Negative visible essentially noncontrast liver, gallbladder, spleen, pancreas, adrenal glands, kidneys, and bowel in the upper abdomen. No upper abdominal free air or free fluid. Musculoskeletal: Negative. Review of the MIP images confirms the above findings. IMPRESSION: 1. Suboptimal but adequate contrast bolus timing with no acute pulmonary embolus identified. 2. Negative CT appearance of the Chest. Electronically Signed  By: Odessa Fleming M.D.   On: 11/28/2021 09:59   DG Chest 2 View  Result Date: 11/28/2021 CLINICAL DATA:  chest pain EXAM: CHEST - 2 VIEW COMPARISON:  March 04, 2020 FINDINGS: The heart size and mediastinal contours are within normal limits. Both lungs are clear and stable. The visualized skeletal structures are unremarkable. IMPRESSION: No active cardiopulmonary disease. Electronically Signed   By: Marjo Bicker M.D.   On: 11/28/2021 08:13    Procedures Procedures  Remained on constant cardiac monitoring, normal sinus rhythm.  Medications Ordered in ED Medications  ketorolac (TORADOL) 30 MG/ML injection 30 mg (30 mg Intravenous Given 11/28/21 0818)  ondansetron (ZOFRAN) injection 4 mg (4 mg Intravenous Given 11/28/21 0817)  iohexol (OMNIPAQUE)  350 MG/ML injection 100 mL (100 mLs Intravenous Contrast Given 11/28/21 8315)    ED Course/ Medical Decision Making/ A&P Clinical Course as of 11/28/21 1059  Thu Nov 28, 2021  0906 Patient's labs with mild anemia hemoglobin 11.1 not requiring transfusion, no complaints of bleeding.  Normal white blood cell count 4.6.  No transaminitis, slightly elevated glucose 106.  Normal sodium 138 and potassium 3.5.  EKG with nonspecific T wave changes and reassuring high sensitive troponin 3, unlikely atypical ACS.  Chest x-ray personally interpreted no pulmonary edema no pneumonia, no pleural effusions.  BMP unremarkable.  COVID and flu negative.  D-dimer elevated, ordering CTA chest to rule out PE. [VB]  1058 Repeat troponin 4 and reassuring.  CTA negative for PE.  Discussed strict  return precautions and made referral to cardiology.  She is safe for discharge home. [VB]    Clinical Course User Index [VB] Mardene Sayer, MD                           Medical Decision Making Marissa Lin is a 51 y.o. female.  With PMH of HLD, DM, arthritis, GERD, back pain presenting with multiple medical complaints.  However, her main complaint is chest tightness like a "knot" associated with shortness of breath and generally feeling unwell.  Regarding the patient's chest pain, shortness of breath, generally feeling unwell, differential includes atypical ACS, viral syndrome, dehydration, pneumonia, possible PE although less likely with no hypoxia, no increased work of breathing and no signs of DVT on exam.  Her HEART score is 3 and overall have an EKG which has nonspecific T wave changes inversions in inferior leads III/avF and anterior lead V3 unchanged from previous.  2 high-sensitivity troponins were obtained and both normal and flat, no concern for atypical ACS.Marland Kitchen  Chest x-ray was obtained which showed no evidence of pneumonia, pneumothorax, and pulmonary edema.  A D-dimer was sent which was positive and a  CTA chest was obtained which showed evidence of no PE, no pericardial effusion, no other abnormalities.,  COVID and flu were negative.  Mild anemia hemoglobin 11.1 but otherwise no acute lab changes.  UA negative for UTI.  Work-up was all reassuring and discussed findings with patient.  Advise close follow-up with PCP and strict return precautions.  Made referral to cardiology for outpatient follow-up regarding atypical chest pain.  She is safe for discharge at this time.  She is in agreement with this plan.   Amount and/or Complexity of Data Reviewed Labs: ordered. Decision-making details documented in ED Course. Radiology: ordered and independent interpretation performed. Decision-making details documented in ED Course. ECG/medicine tests: independent interpretation performed. Decision-making details documented in ED Course.  Risk Prescription drug management.  Final Clinical Impression(s) / ED Diagnoses Final diagnoses:  Chest pain, unspecified type  Dyspnea, unspecified type    Rx / DC Orders ED Discharge Orders          Ordered    Ambulatory referral to Cardiology       Comments: If you have not heard from the Cardiology office within the next 72 hours please call [phone].   11/28/21 1036              Mardene Sayer, MD 11/28/21 1100

## 2021-11-28 NOTE — ED Triage Notes (Signed)
Patient presents with multiple complaints; c/o "chest doesn't feel right" denies cough / congestion; reports just feeling unwell in general; c/o stuffed up right ear; c/o exacerbation of chronic back pain

## 2021-11-28 NOTE — Discharge Instructions (Addendum)
You were seen in the emergency department today for chest pain. Your workup did not reveal a definite cause of your symptoms but was generally reassuring.  Your COVID and flu test were negative.  The CT scan showed no blood clot, no infection, no fluid.  Your EKG and labs were also reassuring and do not suggest a heart attack at this time.  Return to the emergency department immediately if you develop recurrent, severe chest pain, shortness of breath, fainting spells, sudden sweatiness, or any other concerning symptoms.   Please also make an appointment to follow up with your primary care doctor or cardiologist within one week to assure improvement or resolution in symptoms. Further testing may be necessary, so it is extremely important to keep your follow-up appointment with your primary doctor.

## 2021-11-28 NOTE — ED Notes (Signed)
Patient transported to CT 

## 2021-12-12 DIAGNOSIS — R002 Palpitations: Secondary | ICD-10-CM | POA: Insufficient documentation

## 2021-12-12 DIAGNOSIS — R609 Edema, unspecified: Secondary | ICD-10-CM | POA: Insufficient documentation

## 2021-12-12 DIAGNOSIS — F329 Major depressive disorder, single episode, unspecified: Secondary | ICD-10-CM

## 2021-12-12 DIAGNOSIS — K219 Gastro-esophageal reflux disease without esophagitis: Secondary | ICD-10-CM | POA: Insufficient documentation

## 2021-12-12 DIAGNOSIS — G43909 Migraine, unspecified, not intractable, without status migrainosus: Secondary | ICD-10-CM | POA: Insufficient documentation

## 2021-12-12 DIAGNOSIS — R110 Nausea: Secondary | ICD-10-CM | POA: Insufficient documentation

## 2021-12-12 DIAGNOSIS — G5600 Carpal tunnel syndrome, unspecified upper limb: Secondary | ICD-10-CM | POA: Insufficient documentation

## 2021-12-12 DIAGNOSIS — K5900 Constipation, unspecified: Secondary | ICD-10-CM | POA: Insufficient documentation

## 2021-12-12 DIAGNOSIS — M549 Dorsalgia, unspecified: Secondary | ICD-10-CM | POA: Insufficient documentation

## 2021-12-12 DIAGNOSIS — E7800 Pure hypercholesterolemia, unspecified: Secondary | ICD-10-CM | POA: Insufficient documentation

## 2021-12-12 DIAGNOSIS — R6889 Other general symptoms and signs: Secondary | ICD-10-CM | POA: Insufficient documentation

## 2021-12-12 DIAGNOSIS — R42 Dizziness and giddiness: Secondary | ICD-10-CM | POA: Insufficient documentation

## 2021-12-12 DIAGNOSIS — M1990 Unspecified osteoarthritis, unspecified site: Secondary | ICD-10-CM | POA: Insufficient documentation

## 2021-12-12 DIAGNOSIS — F419 Anxiety disorder, unspecified: Secondary | ICD-10-CM | POA: Insufficient documentation

## 2021-12-12 HISTORY — DX: Major depressive disorder, single episode, unspecified: F32.9

## 2021-12-15 NOTE — Progress Notes (Unsigned)
Cardiology Office Note:    Date:  12/16/2021   ID:  Marissa Lin, DOB Jan 24, 1971, MRN [id number]  PCP:  Truett Perna, MD  Cardiologist:  Norman Herrlich, MD   Referring MD: Mardene Sayer, MD  ASSESSMENT:    1. Chest pain of uncertain etiology   2. High cholesterol    PLAN:    In order of problems listed above:  She is having chest pain that I would define as possible angina multiple cardiovascular risk factors including significant hyperlipidemia elevation of LDL have asked her to undergo further evaluation with cardiac CTA to define her calcium score to guide the need for lipid-lowering therapy presence absence and severity of CAD and the need for further evaluation or revascularization.  In the interim she will restart her calcium channel blocker.  She has had previous echocardiogram done.  Next appointment 6 weeks following CTA   Medication Adjustments/Labs and Tests Ordered: Current medicines are reviewed at length with the patient today.  Concerns regarding medicines are outlined above.  No orders of the defined types were placed in this encounter.  No orders of the defined types were placed in this encounter.    Chief Complaint  Patient presents with   Chest Pain    History of Present Illness:    Marissa Lin is a 51 y.o. female with a history of hyperlipidemia previous LDL cholesterol 185 hypertension with previous antihypertensive therapy who is being seen today for the evaluation of chest pain at the request of Mardene Sayer, MD.  She was seen at [facility] ED for chest pain 11/28/2021 her chest pain is described as tightness but had a pleuritic component and associated shortness of breath.  D-dimer was elevated at 0.72 and high sensitivy troponin was normal BNP level was quite low at 25 rest x-ray showed no active cardiopulmonary disease and chest CTA pulmonary embolism protocol showed no findings of pulmonary embolism was  otherwise normal with no pulmonary infiltrates and no evidence of coronary artery atherosclerosis.  EKG showed sinus rhythm low voltage nonspecific ST and T changes  Extremity venous duplex study 02/25/2020 showed no findings of DVT Echocardiogram performed 05/12/2020 [facility] showed normal left ventricular size wall thickness systolic and diastolic function EF 50 to 55% and no pericardial or valvular abnormality. Cardiac perfusion study was performed 06/27/2020 showing normal left ventricular perfusion and function EF 54%.  She has no known history of heart disease congenital rheumatic or atrial fibrillation She attributes her symptoms to a great deal of stress in her life She is signs are soft breathless at times at rest she has congestion and wheezes at times and also has episodes of chest discomfort pressure associated with shortness of breath that occurs without exertion but is resolved with rest. For moderate CAD with recent coronary angiogram She previously has been on amlodipine for hypertension and has run out Her LDL is severely elevated Previous perfusion study was normal With concern of CAD she will undergo coronary CTA to define calcium score need for lipid-lowering therapy presence or absence of CAD Her blood pressure is elevated she will resume her calcium channel blocker asked her to purchase a validated [employer] upper extremity digital blood pressure cuff and record blood pressures and bring them to her next visit.  Past Medical History:  Diagnosis Date   Anxiety    Arthritis    Back pain    Carpal tunnel syndrome    Cold feeling    Constipation  GERD (gastroesophageal reflux disease)    High cholesterol    Metatarsal deformity    Migraine    Nausea    Palpitation    Plantar fasciitis    Swelling    feet and legs   Vertigo     Past Surgical History:  Procedure Laterality Date   ABDOMINAL HYSTERECTOMY     CESAREAN SECTION      Current  Medications: Current Meds  Medication Sig   cetirizine (ZYRTEC) 10 MG tablet Take 10 mg by mouth daily as needed for allergies.   cyclobenzaprine (FLEXERIL) 5 MG tablet Take 5 mg by mouth 3 (three) times daily as needed for muscle spasms.   escitalopram (LEXAPRO) 5 MG tablet Take 1 tablet by mouth daily.   fluticasone (FLONASE) 50 MCG/ACT nasal spray Place 2 sprays into both nostrils daily as needed for allergies or rhinitis.   gabapentin (NEURONTIN) 300 MG capsule Take 300 mg by mouth at bedtime.   hydrochlorothiazide (HYDRODIURIL) 25 MG tablet Take 25 mg by mouth daily as needed for fluid.   hydrOXYzine (ATARAX) 25 MG tablet Take 25 mg by mouth 2 (two) times daily as needed for itching or anxiety.   ibuprofen (ADVIL) 800 MG tablet Take 800 mg by mouth every 8 (eight) hours as needed for pain.   meloxicam (MOBIC) 15 MG tablet Take 15 mg by mouth daily as needed for pain.   montelukast (SINGULAIR) 10 MG tablet Take 10 mg by mouth at bedtime.   ondansetron (ZOFRAN ODT) 8 MG disintegrating tablet Take 1 tablet (8 mg total) by mouth every 8 (eight) hours as needed for nausea or vomiting.   potassium chloride SA (KLOR-CON M) 20 MEQ tablet Take 20 mEq by mouth. 1 daily when taking HCTZ   [DISCONTINUED] montelukast (SINGULAIR) 10 MG tablet Take 10 mg by mouth at bedtime.     Allergies:   Nitrofurantoin, Other, Fluconazole, and Pineapple   Social History   Socioeconomic History   Marital status: Legally Separated    Spouse name: Not on file   Number of children: Not on file   Years of education: Not on file   Highest education level: Not on file  Occupational History   Occupation: Transit bus driver  Tobacco Use   Smoking status: Never    Passive exposure: Current   Smokeless tobacco: Never  Vaping Use   Vaping Use: Never used  Substance and Sexual Activity   Alcohol use: No    Alcohol/week: 0.0 standard drinks of alcohol   Drug use: No   Sexual activity: Not on file  Other Topics  Concern   Not on file  Social History Narrative   Not on file   Social Determinants of Health   Financial Resource Strain: Not on file  Food Insecurity: Not on file  Transportation Needs: Not on file  Physical Activity: Not on file  Stress: Not on file  Social Connections: Not on file     Family History: The patient's family history includes Diabetes in her father; Drug abuse in her mother; High blood pressure in her mother; Kidney disease in her father; Obesity in her father and mother.  ROS:   ROS Please see the history of present illness.     All other systems reviewed and are negative.  EKGs/Labs/Other Studies Reviewed:    The following studies were reviewed today:   Recent Labs: 11/28/2021: ALT 15; B Natriuretic Peptide 25.3; BUN 16; Creatinine, Ser 0.78; Hemoglobin 11.1; Platelets 321; Potassium 3.5; Sodium  138  Recent Lipid Panel    Component Value Date/Time   CHOL 231 (H) 01/04/2020 1130   TRIG 55 01/04/2020 1130   HDL 50 01/04/2020 1130   LDLCALC 172 (H) 01/04/2020 1130    Physical Exam:    VS:  BP (!) 144/94 (BP Location: Right Arm, Patient Position: Sitting, Cuff Size: Large)   Pulse 74   Ht 5' (1.524 m)   Wt 230 lb 2.4 oz (104.4 kg)   SpO2 98%   BMI 44.95 kg/m     Wt Readings from Last 3 Encounters:  12/16/21 230 lb 2.4 oz (104.4 kg)  11/28/21 230 lb (104.3 kg)  03/04/20 260 lb (117.9 kg)     GEN: Obese her BMI approaches 45 well nourished, well developed in no acute distress HEENT: Normal NECK: No JVD; No carotid bruits LYMPHATICS: No lymphadenopathy CARDIAC: RRR, no murmurs, rubs, gallops RESPIRATORY:  Clear to auscultation without rales, wheezing or rhonchi  ABDOMEN: Soft, non-tender, non-distended MUSCULOSKELETAL:  No edema; No deformity  SKIN: Warm and dry NEUROLOGIC:  Alert and oriented x 3 PSYCHIATRIC:  Normal affect     Signed, Norman Herrlich, MD  12/16/2021 2:18 PM    [facility]

## 2021-12-16 ENCOUNTER — Ambulatory Visit: Payer: Managed Care, Other (non HMO) | Attending: Cardiology | Admitting: Cardiology

## 2021-12-16 ENCOUNTER — Encounter: Payer: Self-pay | Admitting: Cardiology

## 2021-12-16 VITALS — BP 144/94 | HR 74 | Ht 60.0 in | Wt 230.2 lb

## 2021-12-16 DIAGNOSIS — R079 Chest pain, unspecified: Secondary | ICD-10-CM

## 2021-12-16 DIAGNOSIS — E7800 Pure hypercholesterolemia, unspecified: Secondary | ICD-10-CM

## 2021-12-16 MED ORDER — AMLODIPINE BESYLATE 5 MG PO TABS: 5.0000 mg | ORAL_TABLET | Freq: Every day | ORAL | 3 refills | Status: DC

## 2021-12-16 MED ORDER — METOPROLOL TARTRATE 100 MG PO TABS: 100.0000 mg | ORAL_TABLET | Freq: Once | ORAL | 0 refills | Status: DC

## 2021-12-16 NOTE — Patient Instructions (Signed)
Medication Instructions:  Your physician has recommended you make the following change in your medication:   START: Amlodipine 5 mg daily  *If you need a refill on your cardiac medications before your next appointment, please call your pharmacy*   Lab Work: Your physician recommends that you return for lab work in:   Labs 1 week before CT: BMP  If you have labs (blood work) drawn today and your tests are completely normal, you will receive your results only by: MyChart Message (if you have MyChart) OR A paper copy in the mail If you have any lab test that is abnormal or we need to change your treatment, we will call you to review the results.   Testing/Procedures:   Your cardiac CT will be scheduled at one of the below locations:   [facility] [address] [place], [place] [address] [phone]  OR  [facility] [address] [place], [place] [address] [phone]  OR   [facility] [address] [place], [place] [address] [phone]  If scheduled at [facility], please arrive at the [facility] (Entrance C2) of [facility] 30 minutes prior to test start time. You can use the FREE valet parking offered at entrance C (encouraged to control the heart rate for the test)  Proceed to the [facility] Department (first floor) to check-in and test prep.  All radiology patients and guests should use entrance C2 at [facility], accessed from [facility] [employer] even though the hospital's physical address listed is [address].    If scheduled at [facility] or [facility], please arrive 15 mins early for check-in and test prep.   Please follow these instructions carefully (unless otherwise directed):  On the Night Before the Test: Be sure to Drink plenty of water. Do  not consume any caffeinated/decaffeinated beverages or chocolate 12 hours prior to your test. Do not take any antihistamines 12 hours prior to your test.  On the Day of the Test: Drink plenty of water until 1 hour prior to the test. You may take your regular medications prior to the test.  Take metoprolol (Lopressor) two hours prior to test. HOLD Hydrochlorothiazide morning of the test. FEMALES- please wear underwire-free bra if available, avoid dresses & tight clothing      After the Test: Drink plenty of water. After receiving IV contrast, you may experience a mild flushed feeling. This is normal. On occasion, you may experience a mild rash up to 24 hours after the test. This is not dangerous. If this occurs, you can take Benadryl 25 mg and increase your fluid intake. If you experience trouble breathing, this can be serious. If it is severe call 911 IMMEDIATELY. If it is mild, please call our office.  We will call to schedule your test 2-4 weeks out understanding that some insurance companies will need an authorization prior to the service being performed.   For non-scheduling related questions, please contact the cardiac imaging nurse navigator should you have any questions/concerns: Rockwell Alexandria, Cardiac Imaging Nurse Navigator Larey Brick, Cardiac Imaging Nurse Navigator [facility] Direct Office Dial: [phone]   For scheduling needs, including cancellations and rescheduling, please call [place], [phone].    Follow-Up: At [facility], you and your health needs are our priority.  As part of our continuing mission to provide you with  exceptional heart care, we have created designated Provider Care Teams.  These Care Teams include your primary Cardiologist (physician) and Advanced Practice Providers (APPs -  Physician Assistants and Nurse Practitioners) who all work together to provide you with the care you need, when you need it.  We  recommend signing up for the patient portal called "MyChart".  Sign up information is provided on this After Visit Summary.  MyChart is used to connect with patients for Virtual Visits (Telemedicine).  Patients are able to view lab/test results, encounter notes, upcoming appointments, etc.  Non-urgent messages can be sent to your provider as well.   To learn more about what you can do with MyChart, go to [email or web address].    Your next appointment:   6 week(s)  The format for your next appointment:   In Person  Provider:   Gypsy Balsam, MD    Other Instructions Get Omron upper arm blood pressure cuff  Record blood pressures and bring to your next visit  Important Information About Sugar      Healthbeat  Tips to measure your blood pressure correctly  To determine whether you have hypertension, a medical professional will take a blood pressure reading. How you prepare for the test, the position of your arm, and other factors can change a blood pressure reading by 10% or more. That could be enough to hide high blood pressure, start you on a drug you don't really need, or lead your doctor to incorrectly adjust your medications. National and international guidelines offer specific instructions for measuring blood pressure. If a doctor, nurse, or medical assistant isn't doing it right, don't hesitate to ask him or her to get with the guidelines. Here's what you can do to ensure a correct reading:  Don't drink a caffeinated beverage or smoke during the 30 minutes before the test.  Sit quietly for five minutes before the test begins.  During the measurement, sit in a chair with your feet on the floor and your arm supported so your elbow is at about heart level.  The inflatable part of the cuff should completely cover at least 80% of your upper arm, and the cuff should be placed on bare skin, not over a shirt.  Don't talk during the measurement.  Have your blood pressure measured  twice, with a brief break in between. If the readings are different by 5 points or more, have it done a third time. There are times to break these rules. If you sometimes feel lightheaded when getting out of bed in the morning or when you stand after sitting, you should have your blood pressure checked while seated and then while standing to see if it falls from one position to the next. Because blood pressure varies throughout the day, your doctor will rarely diagnose hypertension on the basis of a single reading. Instead, he or she will want to confirm the measurements on at least two occasions, usually within a few weeks of one another. The exception to this rule is if you have a blood pressure reading of 180/110 mm Hg or higher. A result this high usually calls for prompt treatment. It's also a good idea to have your blood pressure measured in both arms at least once, since the reading in one arm (usually the right) may be higher than that in the left. A 2014 study in The American Journal of Medicine of nearly 3,400 people found average arm- to-arm differences in systolic blood pressure of about 5 points. The  higher number should be used to make treatment decisions. In 2017, new guidelines from the American Heart Association, the [employer] of Cardiology, and nine other health organizations lowered the diagnosis of high blood pressure to 130/80 mm Hg or higher for all adults. The guidelines also redefined the various blood pressure categories to now include normal, elevated, Stage 1 hypertension, Stage 2 hypertension, and hypertensive crisis (see "Blood pressure categories"). Blood pressure categories  Blood pressure category SYSTOLIC (upper number)  DIASTOLIC (lower number)  Normal Less than 120 mm Hg and Less than 80 mm Hg  Elevated 120-129 mm Hg and Less than 80 mm Hg  High blood pressure: Stage 1 hypertension 130-139 mm Hg or 80-89 mm Hg  High blood pressure: Stage 2 hypertension 140 mm Hg  or higher or 90 mm Hg or higher  Hypertensive crisis (consult your doctor immediately) Higher than 180 mm Hg and/or Higher than 120 mm Hg  Source: American Heart Association and American Stroke Association. For more on getting your blood pressure under control, buy Controlling Your Blood Pressure, a Special Health Report from [facility]

## 2021-12-18 DIAGNOSIS — G8929 Other chronic pain: Secondary | ICD-10-CM

## 2021-12-18 DIAGNOSIS — M7918 Myalgia, other site: Secondary | ICD-10-CM | POA: Insufficient documentation

## 2021-12-18 HISTORY — DX: Other chronic pain: G89.29

## 2022-01-07 ENCOUNTER — Telehealth (HOSPITAL_COMMUNITY): Payer: Self-pay | Admitting: *Deleted

## 2022-01-07 NOTE — Telephone Encounter (Signed)
Reaching out to patient to offer assistance regarding upcoming cardiac imaging study; pt verbalizes understanding of appt date/time, parking situation and where to check in,  medications ordered, and verified current allergies; name and call back number provided for further questions should they arise  Gordy Clement RN Navigator Cardiac Imaging [facility] [phone] office [phone] cell  Patient to take 100mg  metoprolol tartrate two hours prior to her cardiac CT scan. She is aware to arrive at 10:30am.

## 2022-01-08 ENCOUNTER — Ambulatory Visit (HOSPITAL_COMMUNITY)
Admission: RE | Admit: 2022-01-08 | Discharge: 2022-01-08 | Disposition: A | Discharge status: Home / Self Care | Payer: Managed Care, Other (non HMO) | Source: Ambulatory Visit | Attending: Cardiology | Admitting: Cardiology

## 2022-01-08 DIAGNOSIS — R079 Chest pain, unspecified: Secondary | ICD-10-CM | POA: Diagnosis not present

## 2022-01-08 DIAGNOSIS — E7800 Pure hypercholesterolemia, unspecified: Secondary | ICD-10-CM | POA: Insufficient documentation

## 2022-01-08 MED ORDER — NITROGLYCERIN 0.4 MG SL SUBL
SUBLINGUAL_TABLET | SUBLINGUAL | Status: AC
  Filled 2022-01-08: qty 2

## 2022-01-08 MED ORDER — NITROGLYCERIN 0.4 MG SL SUBL
0.8000 mg | SUBLINGUAL_TABLET | Freq: Once | SUBLINGUAL | Status: AC
  Administered 2022-01-08: 0.8 mg via SUBLINGUAL

## 2022-01-08 MED ORDER — IOHEXOL 350 MG/ML SOLN
95.0000 mL | Freq: Once | INTRAVENOUS | Status: AC | PRN
  Administered 2022-01-08: 95 mL via INTRAVENOUS

## 2022-01-08 NOTE — Progress Notes (Signed)
Pt. States, " I am feeling a little better. I think I'm good to go."

## 2022-01-08 NOTE — Progress Notes (Signed)
Pt. Experiencing some SOB. VS stable. Pt. States, "I just feel funny." Pt. Taken to nurse's station for observation.

## 2022-01-09 ENCOUNTER — Telehealth: Payer: Self-pay

## 2022-01-09 DIAGNOSIS — M47816 Spondylosis without myelopathy or radiculopathy, lumbar region: Secondary | ICD-10-CM | POA: Insufficient documentation

## 2022-01-09 NOTE — Telephone Encounter (Signed)
Patient notified of the following per Dr. Munley. °

## 2022-01-09 NOTE — Telephone Encounter (Signed)
-----   Message from Richardo Priest, MD sent at 01/09/2022  9:25 AM EDT ----- This is normal she can follow-up in the office as needed in the future and at this time I would not advise starting a statin.

## 2022-01-20 ENCOUNTER — Other Ambulatory Visit: Payer: Self-pay | Admitting: Cardiology

## 2022-01-20 NOTE — Telephone Encounter (Signed)
Metoprolol tartrate refill request denied, was a 1 time dose

## 2022-01-27 ENCOUNTER — Encounter: Payer: Self-pay | Admitting: Cardiology

## 2022-01-27 ENCOUNTER — Ambulatory Visit: Payer: Managed Care, Other (non HMO) | Attending: Cardiology | Admitting: Cardiology

## 2022-01-27 VITALS — BP 128/74 | HR 63 | Ht 60.0 in | Wt 231.0 lb

## 2022-01-27 DIAGNOSIS — E785 Hyperlipidemia, unspecified: Secondary | ICD-10-CM

## 2022-01-27 DIAGNOSIS — I10 Essential (primary) hypertension: Secondary | ICD-10-CM

## 2022-01-27 DIAGNOSIS — E119 Type 2 diabetes mellitus without complications: Secondary | ICD-10-CM | POA: Diagnosis not present

## 2022-01-27 DIAGNOSIS — R072 Precordial pain: Secondary | ICD-10-CM | POA: Diagnosis not present

## 2022-01-27 DIAGNOSIS — F411 Generalized anxiety disorder: Secondary | ICD-10-CM
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
## 2022-01-27 NOTE — Patient Instructions (Addendum)
Medication Instructions:  Your physician recommends that you continue on your current medications as directed. Please refer to the Current Medication list given to you today.  *If you need a refill on your cardiac medications before your next appointment, please call your pharmacy*   Lab Work: [place], ALT- today If you have labs (blood work) drawn today and your tests are completely normal, you will receive your results only by: [place] (if you have MyChart) OR A paper copy in the mail If you have any lab test that is abnormal or we need to change your treatment, we will call you to review the results.   Testing/Procedures: None Ordered   Follow-Up: At [facility], you and your health needs are our priority.  As part of our continuing mission to provide you with exceptional heart care, we have created designated Provider Care Teams.  These Care Teams include your primary Cardiologist (physician) and Advanced Practice Providers (APPs -  Physician Assistants and Nurse Practitioners) who all work together to provide you with the care you need, when you need it.  We recommend signing up for the patient portal called "MyChart".  Sign up information is provided on this After Visit Summary.  MyChart is used to connect with patients for Virtual Visits (Telemedicine).  Patients are able to view lab/test results, encounter notes, upcoming appointments, etc.  Non-urgent messages can be sent to your provider as well.   To learn more about what you can do with MyChart, go to [email or web address].    Your next appointment:   12 month(s)  The format for your next appointment:   In Person  Provider:   Jenne Campus, MD    Other Instructions NA

## 2022-01-29 LAB — LIPID PANEL
Chol/HDL Ratio: 3 ratio (ref 0.0–4.4)
Cholesterol, Total: 182 mg/dL (ref 100–199)
HDL: 61 mg/dL (ref >39)
LDL Chol Calc (NIH): 111 mg/dL — ABNORMAL HIGH (ref 0–99)
Triglycerides: 49 mg/dL (ref 0–149)
VLDL Cholesterol Cal: 10 mg/dL (ref 5–40)

## 2022-01-29 LAB — AST: AST: 16 IU/L (ref 0–40)

## 2022-01-29 LAB — ALT: ALT: 24 IU/L (ref 0–32)

## 2022-02-03 ENCOUNTER — Telehealth: Payer: Self-pay

## 2022-02-03 NOTE — Telephone Encounter (Signed)
Results reviewed with pt as per Dr. Krasowski's note.  Pt verbalized understanding and had no additional questions. Routed to PCP  

## 2022-06-11 ENCOUNTER — Encounter (HOSPITAL_BASED_OUTPATIENT_CLINIC_OR_DEPARTMENT_OTHER): Payer: Self-pay | Admitting: Emergency Medicine

## 2022-06-11 ENCOUNTER — Other Ambulatory Visit: Payer: Self-pay

## 2022-06-11 ENCOUNTER — Emergency Department (HOSPITAL_BASED_OUTPATIENT_CLINIC_OR_DEPARTMENT_OTHER)
Admission: EM | Admit: 2022-06-11 | Discharge: 2022-06-12 | Disposition: A | Discharge status: Home / Self Care | Payer: Medicaid Other | Attending: Emergency Medicine | Admitting: Emergency Medicine

## 2022-06-11 DIAGNOSIS — Z79899 Other long term (current) drug therapy: Secondary | ICD-10-CM | POA: Insufficient documentation

## 2022-06-11 DIAGNOSIS — R0789 Other chest pain: Secondary | ICD-10-CM | POA: Diagnosis not present

## 2022-06-11 DIAGNOSIS — R058 Other specified cough: Secondary | ICD-10-CM | POA: Insufficient documentation

## 2022-06-11 DIAGNOSIS — I10 Essential (primary) hypertension: Secondary | ICD-10-CM | POA: Diagnosis not present

## 2022-06-11 NOTE — ED Triage Notes (Signed)
Pt states was seen for wheezing and SOB was tested for flu and Covid was negative.treated with steroids. States still having SOB with left chest/shoulder pain and cough.

## 2022-06-12 ENCOUNTER — Emergency Department (HOSPITAL_BASED_OUTPATIENT_CLINIC_OR_DEPARTMENT_OTHER): Payer: Medicaid Other

## 2022-06-12 LAB — BASIC METABOLIC PANEL
Anion gap: 4 — ABNORMAL LOW (ref 5–15)
BUN: 20 mg/dL (ref 6–20)
CO2: 25 mmol/L (ref 22–32)
Calcium: 8.3 mg/dL — ABNORMAL LOW (ref 8.9–10.3)
Chloride: 106 mmol/L (ref 98–111)
Creatinine, Ser: 0.63 mg/dL (ref 0.44–1.00)
GFR, Estimated: >60 mL/min (ref >60)
Glucose, Bld: 111 mg/dL — ABNORMAL HIGH (ref 70–99)
Potassium: 4 mmol/L (ref 3.5–5.1)
Sodium: 135 mmol/L (ref 135–145)

## 2022-06-12 LAB — CBC WITH DIFFERENTIAL/PLATELET
Abs Immature Granulocytes: 0.07 10*3/uL (ref 0.00–0.07)
Basophils Absolute: 0.1 10*3/uL (ref 0.0–0.1)
Basophils Relative: 1 %
Eosinophils Absolute: 0.2 10*3/uL (ref 0.0–0.5)
Eosinophils Relative: 3 %
HCT: 33.1 % — ABNORMAL LOW (ref 36.0–46.0)
Hemoglobin: 10.8 g/dL — ABNORMAL LOW (ref 12.0–15.0)
Immature Granulocytes: 1 %
Lymphocytes Relative: 31 %
Lymphs Abs: 2.3 10*3/uL (ref 0.7–4.0)
MCH: 27 pg (ref 26.0–34.0)
MCHC: 32.6 g/dL (ref 30.0–36.0)
MCV: 82.8 fL (ref 80.0–100.0)
Monocytes Absolute: 0.6 10*3/uL (ref 0.1–1.0)
Monocytes Relative: 9 %
Neutro Abs: 4.1 10*3/uL (ref 1.7–7.7)
Neutrophils Relative %: 55 %
Platelets: 341 10*3/uL (ref 150–400)
RBC: 4 MIL/uL (ref 3.87–5.11)
RDW: 14.9 % (ref 11.5–15.5)
WBC: 7.3 10*3/uL (ref 4.0–10.5)
nRBC: 0 % (ref 0.0–0.2)

## 2022-06-12 LAB — TROPONIN I (HIGH SENSITIVITY): Troponin I (High Sensitivity): 4 ng/L (ref <18)

## 2022-06-12 MED ORDER — KETOROLAC TROMETHAMINE 30 MG/ML IJ SOLN
30.0000 mg | Freq: Once | INTRAMUSCULAR | Status: AC
  Administered 2022-06-12: 30 mg via INTRAMUSCULAR
  Filled 2022-06-12: qty 1

## 2022-06-12 NOTE — ED Provider Notes (Signed)
[facility] [facility]  Provider Note  CSN: [id number] Arrival date & time: 06/11/22 2332  History Chief Complaint  Patient presents with   Shortness of Breath    Marissa Lin is a 52 y.o. female with history of HTN, HLD reports viral cough for the last 10 days or so. Went to UC on 2/28 and given doxycycline (for pneumonia, no imaging done), steroids, tessalon and flonase. She has continued to have a cough, some sputum. No fever. Tonight she was in bed when she began to have an aching pain in L upper chest/shoulder area. Her shoulder had been bothering her for a few days related to a water aerobics class she is taking but the chest pain was new. She has no history of CAD, had a CT Coronaries in Oct 23 with calcium score of 0.    Home Medications Prior to Admission medications   Medication Sig Start Date End Date Taking? Authorizing Provider  amLODipine (NORVASC) 5 MG tablet Take 1 tablet (5 mg total) by mouth daily. 12/16/21   Richardo Priest, MD  cetirizine (ZYRTEC) 10 MG tablet Take 10 mg by mouth daily as needed for allergies. 09/11/21   [provider]  cyclobenzaprine (FLEXERIL) 5 MG tablet Take 5 mg by mouth 3 (three) times daily as needed for muscle spasms. 11/27/21   [provider]  escitalopram (LEXAPRO) 5 MG tablet Take 1 tablet by mouth daily.    [provider]  fluticasone (FLONASE) 50 MCG/ACT nasal spray Place 2 sprays into both nostrils daily as needed for allergies or rhinitis. 09/11/21   [provider]  gabapentin (NEURONTIN) 300 MG capsule Take 300 mg by mouth at bedtime. 11/05/21   [provider]  hydrochlorothiazide (HYDRODIURIL) 25 MG tablet Take 25 mg by mouth daily as needed for fluid. 12/12/21   [provider]  hydrOXYzine (ATARAX) 25 MG tablet Take 25 mg by mouth 2 (two) times daily as needed for itching or anxiety. 09/25/21   [provider]  ibuprofen (ADVIL) 800  MG tablet Take 800 mg by mouth every 8 (eight) hours as needed for pain.    [provider]  meloxicam (MOBIC) 15 MG tablet Take 15 mg by mouth daily as needed for pain. 10/10/21   [provider]  metoprolol tartrate (LOPRESSOR) 100 MG tablet Take 1 tablet (100 mg total) by mouth once for 1 dose. Please take 2 hours before CT 12/16/21 01/27/22  Richardo Priest, MD  montelukast (SINGULAIR) 10 MG tablet Take 10 mg by mouth at bedtime. 09/11/21   [provider]  ondansetron (ZOFRAN ODT) 8 MG disintegrating tablet Take 1 tablet (8 mg total) by mouth every 8 (eight) hours as needed for nausea or vomiting. 11/24/19   Molpus, John, MD  potassium chloride SA (KLOR-CON M) 20 MEQ tablet Take 20 mEq by mouth. 1 daily when taking HCTZ 12/12/21   [provider]     Allergies    Nitrofurantoin, Fluconazole, and Pineapple   Review of Systems   Review of Systems Please see HPI for pertinent positives and negatives  Physical Exam BP (!) 116/105 (BP Location: Left Arm)   Pulse 70   Temp 98.2 F (36.8 C) (Oral)   Resp 18   Ht 5' (1.524 m)   Wt 104.3 kg   SpO2 100%   BMI 44.92 kg/m   Physical Exam Vitals and nursing note reviewed.  Constitutional:      Appearance: Normal  appearance.  HENT:     Head: Normocephalic and atraumatic.     Nose: Nose normal.     Mouth/Throat:     Mouth: Mucous membranes are moist.  Eyes:     Extraocular Movements: Extraocular movements intact.     Conjunctiva/sclera: Conjunctivae normal.  Cardiovascular:     Rate and Rhythm: Normal rate.  Pulmonary:     Effort: Pulmonary effort is normal.     Breath sounds: Normal breath sounds. No decreased breath sounds or wheezing.  Chest:     Chest wall: Tenderness (L upper chest reproduces symptoms) present.  Abdominal:     General: Abdomen is flat.     Palpations: Abdomen is soft.     Tenderness: There is no abdominal tenderness.  Musculoskeletal:        General: No swelling. Normal  range of motion.     Cervical back: Neck supple.  Skin:    General: Skin is warm and dry.  Neurological:     General: No focal deficit present.     Mental Status: She is alert.  Psychiatric:        Mood and Affect: Mood normal.     ED Results / Procedures / Treatments   EKG EKG Interpretation  Date/Time:  Wednesday June 11 2022 23:57:25 EST Ventricular Rate:  80 PR Interval:  141 QRS Duration: 96 QT Interval:  381 QTC Calculation: 440 R Axis:   2 Text Interpretation: Sinus rhythm Low voltage, precordial leads No significant change since last tracing Confirmed by Calvert Cantor [phone]) on 06/12/2022 12:19:57 AM  Procedures Procedures  Medications Ordered in the ED Medications  ketorolac (TORADOL) 30 MG/ML injection 30 mg (30 mg Intramuscular Given 06/12/22 0152)    Initial Impression and Plan  Patient with some risk factors for CAD but recent negative calcium score here for persistent cough and now left upper chest pain. Low likelihood of ACS or other acute life threatening process such as PE, PNA or PTX. Will check labs and CXR. Toradol for pain.   ED Course   Clinical Course as of 06/12/22 0237  Thu Jun 12, 2022  0122 I personally viewed the images from radiology studies and agree with radiologist interpretation: CXR is clear [CS]  0205 CBC shows mild anemia, similar to previous.  [CS]  0234 BMP and Trop are normal. Given duration of symptoms and atypical nature, repeat trop is not necessary to rule out AMI. Patient is feeling better after Toradol, plan discharge home. Advised that post-viral cough may last for several weeks, can try OTC cough medications. PCP follow up, RTED for any other concerns.   [CS]    Clinical Course User Index [CS] Truddie Hidden, MD     MDM Rules/Calculators/A&P Medical Decision Making Problems Addressed: Atypical chest pain: acute illness or injury Post-viral cough syndrome: acute illness or injury  Amount and/or Complexity of  Data Reviewed Labs: ordered. Decision-making details documented in ED Course. Radiology: ordered and independent interpretation performed. Decision-making details documented in ED Course. ECG/medicine tests: ordered and independent interpretation performed. Decision-making details documented in ED Course.  Risk Prescription drug management.     Final Clinical Impression(s) / ED Diagnoses Final diagnoses:  Post-viral cough syndrome  Atypical chest pain    Rx / DC Orders ED Discharge Orders     None        Truddie Hidden, MD 06/12/22 [phone]

## 2022-07-23 ENCOUNTER — Emergency Department (HOSPITAL_BASED_OUTPATIENT_CLINIC_OR_DEPARTMENT_OTHER): Payer: Medicaid Other

## 2022-07-23 ENCOUNTER — Encounter (HOSPITAL_BASED_OUTPATIENT_CLINIC_OR_DEPARTMENT_OTHER): Payer: Self-pay | Admitting: Emergency Medicine

## 2022-07-23 ENCOUNTER — Emergency Department (HOSPITAL_BASED_OUTPATIENT_CLINIC_OR_DEPARTMENT_OTHER)
Admission: EM | Admit: 2022-07-23 | Discharge: 2022-07-23 | Disposition: A | Discharge status: Home / Self Care | Payer: Medicaid Other | Attending: Emergency Medicine | Admitting: Emergency Medicine

## 2022-07-23 ENCOUNTER — Other Ambulatory Visit: Payer: Self-pay

## 2022-07-23 DIAGNOSIS — E119 Type 2 diabetes mellitus without complications: Secondary | ICD-10-CM | POA: Diagnosis not present

## 2022-07-23 DIAGNOSIS — R079 Chest pain, unspecified: Secondary | ICD-10-CM | POA: Insufficient documentation

## 2022-07-23 DIAGNOSIS — I10 Essential (primary) hypertension: Secondary | ICD-10-CM | POA: Diagnosis not present

## 2022-07-23 LAB — CBC
HCT: 39.2 % (ref 36.0–46.0)
Hemoglobin: 12.3 g/dL (ref 12.0–15.0)
MCH: 26.6 pg (ref 26.0–34.0)
MCHC: 31.4 g/dL (ref 30.0–36.0)
MCV: 84.7 fL (ref 80.0–100.0)
Platelets: 353 10*3/uL (ref 150–400)
RBC: 4.63 MIL/uL (ref 3.87–5.11)
RDW: 15.7 % — ABNORMAL HIGH (ref 11.5–15.5)
WBC: 6.9 10*3/uL (ref 4.0–10.5)
nRBC: 0 % (ref 0.0–0.2)

## 2022-07-23 LAB — BASIC METABOLIC PANEL
Anion gap: 8 (ref 5–15)
BUN: 20 mg/dL (ref 6–20)
CO2: 22 mmol/L (ref 22–32)
Calcium: 8.8 mg/dL — ABNORMAL LOW (ref 8.9–10.3)
Chloride: 108 mmol/L (ref 98–111)
Creatinine, Ser: 0.62 mg/dL (ref 0.44–1.00)
GFR, Estimated: >60 mL/min (ref >60)
Glucose, Bld: 103 mg/dL — ABNORMAL HIGH (ref 70–99)
Potassium: 3.8 mmol/L (ref 3.5–5.1)
Sodium: 138 mmol/L (ref 135–145)

## 2022-07-23 LAB — TROPONIN I (HIGH SENSITIVITY): Troponin I (High Sensitivity): 3 ng/L (ref <18)

## 2022-07-23 MED ORDER — ACETAMINOPHEN 500 MG PO TABS
1000.0000 mg | ORAL_TABLET | Freq: Once | ORAL | Status: AC
  Administered 2022-07-23: 1000 mg via ORAL
  Filled 2022-07-23: qty 2

## 2022-07-23 MED ORDER — ALUM & MAG HYDROXIDE-SIMETH 200-200-20 MG/5ML PO SUSP
30.0000 mL | Freq: Once | ORAL | Status: AC
  Administered 2022-07-23: 30 mL via ORAL
  Filled 2022-07-23: qty 30

## 2022-07-23 NOTE — Discharge Instructions (Signed)
You were evaluated in the Emergency Department and after careful evaluation, we did not find any emergent condition requiring admission or further testing in the hospital.  Your exam/testing today is overall reassuring.  No signs of heart damage.  Recommend follow-up with your primary care doctor to discuss your symptoms as we discussed.  Please return to the Emergency Department if you experience any worsening of your condition.   Thank you for allowing [place] to be a part of your care.
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
## 2022-07-23 NOTE — ED Triage Notes (Signed)
Pain in right breast since yesterday morning. States has not taken BP meds in a couple days. States feels like contractions in breast.

## 2023-01-27 ENCOUNTER — Other Ambulatory Visit: Payer: Self-pay | Admitting: Cardiology

## 2023-01-28 ENCOUNTER — Ambulatory Visit (INDEPENDENT_AMBULATORY_CARE_PROVIDER_SITE_OTHER): Payer: Medicaid Other

## 2023-01-28 ENCOUNTER — Encounter (HOSPITAL_COMMUNITY): Payer: Self-pay

## 2023-01-28 ENCOUNTER — Ambulatory Visit (HOSPITAL_COMMUNITY)
Admission: EM | Admit: 2023-01-28 | Discharge: 2023-01-28 | Disposition: A | Discharge status: Home / Self Care | Payer: Medicaid Other

## 2023-01-28 ENCOUNTER — Ambulatory Visit (HOSPITAL_COMMUNITY): Payer: Medicaid Other

## 2023-01-28 DIAGNOSIS — M545 Low back pain, unspecified: Secondary | ICD-10-CM

## 2023-01-28 DIAGNOSIS — M25561 Pain in right knee: Secondary | ICD-10-CM | POA: Diagnosis not present

## 2023-01-28 DIAGNOSIS — M79604 Pain in right leg: Secondary | ICD-10-CM | POA: Diagnosis not present

## 2023-01-28 MED ORDER — TRIAMCINOLONE ACETONIDE 40 MG/ML IJ SUSP
INTRAMUSCULAR | Status: AC
  Filled 2023-01-28: qty 1

## 2023-01-28 MED ORDER — TRIAMCINOLONE ACETONIDE 40 MG/ML IJ SUSP
40.0000 mg | Freq: Once | INTRAMUSCULAR | Status: AC
  Administered 2023-01-28: 40 mg via INTRAMUSCULAR

## 2023-01-28 MED ORDER — METHOCARBAMOL 500 MG PO TABS: 500.0000 mg | ORAL_TABLET | Freq: Two times a day (BID) | ORAL | 0 refills | Status: AC
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
## 2023-01-28 NOTE — Discharge Instructions (Addendum)
Your imaging did not show any acute fractures or dislocations.  You still could have muscular or ligament damage that is not visible on x-ray, please follow-up with [facility] or your primary care provider for further evaluation.  You can take the muscle relaxers sparingly, do not drink or drive on this medication as it may cause drowsiness.  Use the brace as needed to help provide stability.   If your pain persists, follow-up with Sports Medicine.

## 2023-01-28 NOTE — ED Triage Notes (Signed)
Patient here today with c/o LB pain and back of right leg pain down to knee after falling this morning. Patient slipped on a rug in the house and when she slipped she did a split. Her right leg went out in front of her. She is also having some pain on the top of her left foot and she did feel a pop in her right knee. Denis taking anything for pain.

## 2023-03-01 NOTE — Progress Notes (Signed)
 Cardiology Office Note:  .   Date:  03/02/2023  ID:  Marissa Lin, DOB October 12, 1970, MRN [id number] PCP: Marissa Perna, MD  [facility] Providers Cardiologist:  Marissa Balsam, MD    History of Present Illness: .   Marissa Lin is a 52 y.o. female with a past medical history of hypertension, migraines, GERD, DM2, dyslipidemia, depression, anxiety.  01/08/2022 coronary CTA calcium score of 0  She established care with Dr. Bing Matter at the behest of her PCP for atypical chest pain.  She had had a coronary CTA which revealed a calcium score of 0.  Most recently she was evaluated by Dr. Bing Matter on 01/27/2022, she was doing well from a cardiac perspective, advised she can follow-up in 1 year.  Follow-up of her hypertension and dyslipidemia.  She has been doing well over the last year, she underwent weight loss surgery and is feeling good.  She does have some ongoing pain in her left upper chest--she has arthritis in her left shoulder, pain seems to be most consistent with that, does not occur with exertion, is not typical for angina and she previously had a coronary CTA revealing a calcium score of 0. She denies chest pain, palpitations, dyspnea, pnd, orthopnea, n, v, dizziness, syncope, edema, weight gain, or early satiety.   ROS: Review of Systems  Musculoskeletal:  Positive for joint pain.     Studies Reviewed: .       Cardiac Studies & Procedures          CT SCANS  CT CORONARY MORPH W/CTA COR W/SCORE 01/08/2022  Addendum 01/08/2022 12:22 PM ADDENDUM REPORT: 01/08/2022 12:20  EXAM: OVER-READ INTERPRETATION  CT CHEST  The following report is a limited chest CT over-read performed by radiologist Dr. Irma Newness [facility] on 01/08/2022. This over-read does not include interpretation of cardiac or coronary anatomy or pathology. The coronary CTA interpretation by the cardiologist is attached.  COMPARISON:  Chest CTA  11/28/2021.  FINDINGS: Mediastinum/Nodes: No enlarged lymph nodes within the visualized mediastinum.  Lungs/Pleura: There is no pleural effusion. The visualized lungs appear clear.  Upper abdomen: No significant findings in the visualized upper abdomen.  Musculoskeletal/Chest wall: No chest wall mass or suspicious osseous findings within the visualized chest. Mild thoracic spondylosis.  IMPRESSION: No significant extracardiac findings within the visualized chest.   Electronically Signed By: Marissa Lin M.D. On: 01/08/2022 12:20  Narrative CLINICAL DATA:  This is a 52 year old female with anginal symptoms.  EXAM: Cardiac/Coronary CTA  TECHNIQUE: The patient was scanned on a [employer].  FINDINGS: A 100 kV prospective scan was triggered in the descending thoracic aorta at 111 HU's. Axial non-contrast 3 mm slices were carried out through the heart. The data set was analyzed on a dedicated work station and scored using the Agatson method. Gantry rotation speed was 250 msecs and collimation was .6 mm. No beta blockade and 0.8 mg of sl NTG was given. The 3D data set was reconstructed in 5% intervals of the 67-82 % of the R-R cycle. Diastolic phases were analyzed on a dedicated work station using MPR, MIP and VRT modes. The patient received 80 cc of contrast.  Image Quality: Fair, with misregistration artifact.  Aorta: Normal size.  No calcifications.  No dissection.  Aortic Valve:  Trileaflet.  No calcifications.  Coronary Arteries:  Normal coronary origin.  Right dominance.  RCA is a large dominant artery that gives rise to PDA and PLA. There is no plaque.  Left  main is a large artery that gives rise to LAD and LCX arteries.  LAD is a large vessel that has no plaque.  LCX is a non-dominant artery that gives rise to one large OM1 branch. There is no plaque.  Coronary Calcium Score:  Left main: 0  Left anterior descending artery: 0  Left  circumflex artery: 0  Right coronary artery: 0  Total: 0  Percentile: 0  Other findings:  Normal pulmonary vein drainage into the left atrium.  Normal left atrial appendage without a thrombus.  Normal size of the pulmonary artery.  IMPRESSION: 1. Coronary calcium score of 0. This was 0 percentile for age and sex matched control.  2. Normal coronary origin with right dominance.  3. No evidence of CAD. CAD-RADS 0. No evidence of CAD (0%). Consider non-atherosclerotic causes of chest pain.  The noncardiac portion of this study will be interpreted in separate report by the radiologist.  Electronically Signed: By: Marissa Lin D.O. On: 01/08/2022 12:07           Risk Assessment/Calculations:             Physical Exam:   VS:  BP 128/80   Pulse (!) 52   Ht 5' (1.524 m)   Wt 190 lb 3.2 oz (86.3 kg)   SpO2 98%   BMI 37.15 kg/m    Wt Readings from Last 3 Encounters:  03/02/23 190 lb 3.2 oz (86.3 kg)  01/28/23 192 lb (87.1 kg)  07/23/22 227 lb (103 kg)    GEN: Well nourished, well developed in no acute distress NECK: No JVD; No carotid bruits CARDIAC: RRR, no murmurs, rubs, gallops RESPIRATORY:  Clear to auscultation without rales, wheezing or rhonchi  ABDOMEN: Soft, non-tender, non-distended EXTREMITIES:  No edema; No deformity   ASSESSMENT AND PLAN: .   Hypertension-blood pressure is well-controlled today at 128/80, continue Norvasc 5 mg daily, continue HCTZ 25 mg daily.  She has noticed that her blood pressure has been trending lower, anticipate this will continue as she continues to lose weight.  Advised her if she notices any orthostasis symptoms, to follow-up with her PCP, they will likely decrease her amlodipine.  Dyslipidemia-will repeat FLP and LFTs today, continue Crestor 10 mg daily.  Atypical chest pain-coronary CTA in October 2023 revealed a calcium score of 0, she continues to have some atypical chest pain that sounds to be most consistent with her  osteoarthritis of her left shoulder.  Obesity-BMI 37, underwent weight loss surgery over the summer and appears to be down 37 pounds, congratulated her on this.  DM2 -most recent A1c was controlled at 5.9%, followed by her PCP.       Dispo: CMET, FLP, return in 1 year.   Signed, Flossie Dibble, NP

## 2023-03-02 ENCOUNTER — Ambulatory Visit: Payer: Medicaid Other | Attending: Cardiology | Admitting: Cardiology

## 2023-03-02 ENCOUNTER — Encounter: Payer: Self-pay | Admitting: Cardiology

## 2023-03-02 VITALS — BP 128/80 | HR 52 | Ht 60.0 in | Wt 190.2 lb

## 2023-03-02 DIAGNOSIS — R0789 Other chest pain: Secondary | ICD-10-CM

## 2023-03-02 DIAGNOSIS — E782 Mixed hyperlipidemia: Secondary | ICD-10-CM | POA: Diagnosis not present

## 2023-03-02 DIAGNOSIS — I10 Essential (primary) hypertension: Secondary | ICD-10-CM | POA: Diagnosis not present

## 2023-03-02 DIAGNOSIS — E119 Type 2 diabetes mellitus without complications: Secondary | ICD-10-CM | POA: Diagnosis not present

## 2023-03-02 DIAGNOSIS — E669 Obesity, unspecified: Secondary | ICD-10-CM

## 2023-03-02 NOTE — Patient Instructions (Signed)
Medication Instructions:  Your physician recommends that you continue on your current medications as directed. Please refer to the Current Medication list given to you today.  *If you need a refill on your cardiac medications before your next appointment, please call your pharmacy*   Lab Work: Your physician recommends that you return for lab work in: Today for CMP and Fasting Lipid Panel  If you have labs (blood work) drawn today and your tests are completely normal, you will receive your results only by: MyChart Message (if you have MyChart) OR A paper copy in the mail If you have any lab test that is abnormal or we need to change your treatment, we will call you to review the results.   Testing/Procedures: NONE   Follow-Up: At [facility], you and your health needs are our priority.  As part of our continuing mission to provide you with exceptional heart care, we have created designated Provider Care Teams.  These Care Teams include your primary Cardiologist (physician) and Advanced Practice Providers (APPs -  Physician Assistants and Nurse Practitioners) who all work together to provide you with the care you need, when you need it.  We recommend signing up for the patient portal called "MyChart".  Sign up information is provided on this After Visit Summary.  MyChart is used to connect with patients for Virtual Visits (Telemedicine).  Patients are able to view lab/test results, encounter notes, upcoming appointments, etc.  Non-urgent messages can be sent to your provider as well.   To learn more about what you can do with MyChart, go to [email or web address].    Your next appointment:   1 year(s)  Provider:   Gypsy Balsam, MD    Other Instructions

## 2023-03-03 ENCOUNTER — Telehealth: Payer: Self-pay | Admitting: Emergency Medicine

## 2023-03-03 DIAGNOSIS — E782 Mixed hyperlipidemia: Secondary | ICD-10-CM

## 2023-03-03 LAB — COMPREHENSIVE METABOLIC PANEL WITH GFR
ALT: 18 IU/L (ref 0–32)
AST: 19 IU/L (ref 0–40)
Albumin: 4.2 g/dL (ref 3.8–4.9)
Alkaline Phosphatase: 77 IU/L (ref 44–121)
BUN/Creatinine Ratio: 17 (ref 9–23)
BUN: 12 mg/dL (ref 6–24)
Bilirubin Total: 0.4 mg/dL (ref 0.0–1.2)
CO2: 25 mmol/L (ref 20–29)
Calcium: 9.4 mg/dL (ref 8.7–10.2)
Chloride: 105 mmol/L (ref 96–106)
Creatinine, Ser: 0.71 mg/dL (ref 0.57–1.00)
Globulin, Total: 2.5 g/dL (ref 1.5–4.5)
Glucose: 78 mg/dL (ref 70–99)
Potassium: 4.4 mmol/L (ref 3.5–5.2)
Sodium: 142 mmol/L (ref 134–144)
Total Protein: 6.7 g/dL (ref 6.0–8.5)
eGFR: 102 mL/min/1.73

## 2023-03-03 LAB — LIPID PANEL
Chol/HDL Ratio: 3.1 ratio (ref 0.0–4.4)
Cholesterol, Total: 182 mg/dL (ref 100–199)
HDL: 59 mg/dL
LDL Chol Calc (NIH): 113 mg/dL — ABNORMAL HIGH (ref 0–99)
Triglycerides: 52 mg/dL (ref 0–149)
VLDL Cholesterol Cal: 10 mg/dL (ref 5–40)

## 2023-03-03 MED ORDER — ROSUVASTATIN CALCIUM 20 MG PO TABS: 20.0000 mg | ORAL_TABLET | Freq: Every day | ORAL | 1 refills | Status: AC

## 2023-03-03 NOTE — Telephone Encounter (Signed)
-----   Message from Flossie Dibble sent at 03/03/2023  8:27 AM EST ----- Cholesterol is too high.   Increase Crestor to 20 mg daily.   Repeat LFTs and FLP in 8 weeks.

## 2023-03-03 NOTE — Telephone Encounter (Signed)
Error

## 2023-03-03 NOTE — Telephone Encounter (Signed)
Results reviewed with pt as per Wallis Bamberg PA's note.  Pt verbalized understanding and had no additional questions. Routed to PCP.

## 2023-05-18 ENCOUNTER — Telehealth: Payer: Self-pay | Admitting: Cardiology

## 2023-05-18 DIAGNOSIS — Z0279 Encounter for issue of other medical certificate: Secondary | ICD-10-CM

## 2023-05-18 NOTE — Telephone Encounter (Signed)
 Patient is requesting call back to get update on if forms needed for physical that were faxed are ready for pick-up. Please advise.

## 2023-05-18 NOTE — Telephone Encounter (Signed)
 Spoke with pt and let her know she will need to come by and pay $29 dollars, cash or check and sign a records release form to get her form faxed or to pick it up. Pt verbalized understanding and had no further questions.

## 2023-05-18 NOTE — Telephone Encounter (Signed)
 Called patient and informed her that we have the form that she faxed to us  and I would work with Pattricia Bores, NP to have it completed and then faxed to the fax number provided. Patient was agreeable with this and had no further questions at this time.

## 2023-05-19 ENCOUNTER — Telehealth: Payer: Self-pay | Admitting: Cardiology

## 2023-05-19 NOTE — Telephone Encounter (Signed)
Forms received via fax from [employer]/patient auth and forms fee collected 29 dollars cash placed in safe/ completed forms scanned into documents and patient called to pick up. Patient picked up forms on 05/19/23

## 2023-05-28 NOTE — Telephone Encounter (Signed)
 Charge posted and payment applied to charge/kbl 05/28/23

## 2023-07-18 ENCOUNTER — Other Ambulatory Visit: Payer: Self-pay

## 2023-07-18 ENCOUNTER — Encounter (HOSPITAL_BASED_OUTPATIENT_CLINIC_OR_DEPARTMENT_OTHER): Payer: Self-pay

## 2023-07-18 DIAGNOSIS — H9201 Otalgia, right ear: Secondary | ICD-10-CM | POA: Insufficient documentation

## 2023-07-18 NOTE — ED Triage Notes (Signed)
 Pt states that she has right ear pain that is described as intermittent and shooting.

## 2023-07-19 ENCOUNTER — Emergency Department (HOSPITAL_BASED_OUTPATIENT_CLINIC_OR_DEPARTMENT_OTHER)
Admission: EM | Admit: 2023-07-19 | Discharge: 2023-07-19 | Disposition: A | Discharge status: Home / Self Care | Attending: Emergency Medicine | Admitting: Emergency Medicine

## 2023-07-19 DIAGNOSIS — H9201 Otalgia, right ear: Secondary | ICD-10-CM

## 2023-07-19 MED ORDER — IBUPROFEN 800 MG PO TABS
800.0000 mg | ORAL_TABLET | Freq: Once | ORAL | Status: AC
  Administered 2023-07-19: 800 mg via ORAL
  Filled 2023-07-19: qty 1

## 2023-07-19 MED ORDER — IBUPROFEN 400 MG PO TABS: 400.0000 mg | ORAL_TABLET | Freq: Four times a day (QID) | ORAL | 0 refills | Status: AC | PRN

## 2023-07-19 MED ORDER — ACETAMINOPHEN 500 MG PO TABS
1000.0000 mg | ORAL_TABLET | Freq: Once | ORAL | Status: AC
  Administered 2023-07-19: 1000 mg via ORAL
  Filled 2023-07-19: qty 2
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
## 2023-12-30 NOTE — Progress Notes (Signed)
 Patient Name: Marissa Lin MR#: [id number] Author: Jordan Miller Case, MD [facility] [place] [phone] [address]    Subjective:   CC: Right knee pain  HPI: Marissa Lin is a 53 y.o. female presents today with right posterior thigh pain and right knee pain.  She was seen at urgent care yesterday following a fall she sustained where she did the splits.  She fell with knee extended and hip flexed to the right leg outstretched anteriorly in the left leg went posteriorly.  She is not having any pain through the left leg.  She states she also felt a pop to the right knee.  Has had pain localized to the right medial joint line of the knee without radiation or numbness or tingling to her leg.  She localizes the pain primarily through her buttocks and down the posterior thigh.  She states the pain gets to the knee and increases with activity.  She states the pain also increases with sitting on the right ischial tuberosity.  She was prescribed Robaxin  as an antispasmodic medication yesterday at urgent care and reports minimal effects when taken yesterday for her pain.  She has recently moved and does not have any leftover meloxicam  prescription. HPI     Pain    Additional comments: Room 4. 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         Follow-UP Pain    Additional comments: Room 4. Pt did have a fall 11/02/2023. She is currently with [facility]. regarding such. Pt states she did fall into a split. While at the [facility]. appt she was adviesed her knees are bone on bone and wasn't aware. Numbness is present. Cannot stand for a long periods time.       Last edited by Carlin DELENA Moons, CMA on 12/30/2023 11:37 AM.       --- Interval History  April 22 2023:  Patient returns for right knee pain; she reports she is unable to apply full pressure and states  it feels like a fat pillow , aches and pain within the hip. She continues  to walk with an limp.reports numbess and rtingless for peripds of standing and sititng.   July 13 2023: following up for the right knee.    October 07 2023: Pt states roughly about 3 weeks ago, she was bitten by a dog. Pt states she has pain from her hip radiating down in to her Right Foot. Pt states her entire leg just aches. Pt states the numbness is still there even after 3 years. Pt is unable to sleep due to pain. Pt stated her leg is still numb even after 3 years. Pt requests a renewal on the handicap tag.    December 30, 2023: Pt did have a fall 11/02/2023. She is currently with [facility]. regarding such. Pt states she did fall into a split. While at the [facility]. appt she was adviesed her knees are bone on bone and wasn't aware. Numbness is present. Cannot stand for a long periods time.  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 ---  Objective:   Physical Exam:  General:  Alert and oriented x 3, NAD, well kempt, and of stated age.  HEENT:  Head is normocephalic and atraumatic. Extraocular muscles are grossly intact. Pupils are equal, round, and reactive to light and accommodation. Nares appeared  normal. Mucous membranes are moist.  Neck:  No visible swelling.  Cardiovascular:  RRR  Respiratory:  No audible wheezing, unlabored respirations.  Neurological:  Cranial nerves II through XII appear grossly intact.   Skin:  Clean and dry, well kempt.  Psychiatric:  Good affect, stable, cooperative  Extremities:  Pink without cyanosis, bilaterally.  Musculoskeletal:  Right hip: Tenderness to palpation over the ischial tuberosity and extending through the biceps for Morris musculature as well as some ice tendinosis and semimembranosus musculature.  No pain with passive hip flexion, internal rotation, external rotation.  Bilateral hip flexion, hip abduction, hip adduction strength 5/5.  Knee flexion 4/5 strength due to apprehension on right  and 5/5 on the left.  Bilateral knee extension 5/5 strength.     Right Knee: numbness around the prior incision Mild medial joint line tenderness  Marked palpation at the greater trochanter   Left shoulder:   __ Diagnostic Studies/Labs:  X-rays of right knee on 01/28/2023 ordered by another provider demonstrate no acute fracture.  There is tricompartmental degenerative changes with most notable change to the medial joint line with loss of joint line space and spurring.  Right hip on 01/29/2023 demonstrate no acute fracture or advanced degenerative change.  There is no evidence of avulsion fracture or bony pathology to the ischial tuberosity.  __ Assessment:  53 y.o. female with right knee arthritis.     Discussion:  Patient's imaging and clinical findings are consistent with arthritis. We have agreed to proceed with a cortisone injection to the right knee today. Patient tolerated the procedure well.  Large joint arthrocentesis: R knee on 12/30/2023 11:10 AMIndications: pain Details: 21 G needle, anterior approach Medications: 2 mL lidocaine 10 mg/mL (1 %); 40 mg triamcinolone  acetonide 40 mg/mL Outcome: tolerated well, no immediate complications Procedure, treatment alternatives, risks and benefits explained, specific risks discussed. Consent was given by the patient. Patient was prepped and draped in the usual sterile fashion.     Plan of care:  R Knee CSI  Follow up as needed.  __ MDM:   Medical Decision Making Elements:  Number/Complexity of Problems: 1 or chronic illnesses with exacerbation Risk of Complications/morbidity: low  Amount/Complexity of Data: low  This document serves as a record of services personally performed by Jordan Case, MD. It was created on their behalf by , Morgan Gee  a trained medical scribe. The creation of this record is the provider's dictation and/or activities during the visit.   Electronically signed by: Morgan Gee, Scribe 10/07/2023  8:48 AM  I agree the documentation is accurate and complete.  Electronically signed by: Jordan Case, MD 10/07/2023 8:48 AM

## 2024-01-20 NOTE — Progress Notes (Signed)
 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  Onset of symptoms was last night, unchanged since that time. Associated symptoms include mild nausea. Evaluation to date: none. Treatment to date: none  ROS Denies fever Denies nasal and sinus congestion, sneezing, ear pain, sore throat, watery eye, anosmia, ageusia Denies cough, dyspnea, chest pain, diaphoresis, leg swelling Denies vomiting, diarrhea, constipation, abdominal pain Denies frequency, urgency, dysuria Denies rash A complete ROS was performed with postive/negative pertinent findings listed in the HPI. All other systems are negative.    The following portions of the patient's history were reviewed and updated as appropriate: allergies, current medications, past family history, past medical history, past social history, past surgical history, and problem list.  Allergies Allergen Reactions  . Fluconazole Hives and Rash  . Pineapple Other (See Comments)    It makes my tongue raw   . Nitrofurantoin Rash   Current Outpatient Medications:  .  amLODIPine  (NORVASC ) 5 mg tablet, Take 1 tablet (5 mg total) by mouth daily., Disp: 90 tablet, Rfl: 3 .  aprepitant (EMEND) 40 mg cap, , Disp: , Rfl:  .  cetirizine  (ZyrTEC ) 10 mg tablet, Take 1 tablet (10 mg total) by mouth daily., Disp: 90 tablet, Rfl: 3 .  cyclobenzaprine  (FLEXERIL ) 5 mg tablet, Take 1 tablet (5 mg total) by mouth 3 (three) times a day as needed for muscle spasms., Disp: 30 tablet, Rfl: 0 .  DULoxetine (CYMBALTA) 20 mg capsule, Take 20 mg by mouth daily., Disp: , Rfl:  .  ergocalciferol  (VITAMIN D2) 1,250 mcg (50,000 unit) capsule, Take 50,000 Units by mouth every 7 days., Disp: 12 capsule, Rfl: 3 .  hydroCHLOROthiazide (HYDRODIURIL) 25 mg tablet, Take 25 mg by mouth daily as needed., Disp: 30 tablet, Rfl: 3 .  hydrocortisone 2.5 % cream, Apply  topically 2 (two) times a day., Disp: 28 g, Rfl: 0 .  hyoscyamine (LEVSIN/SL) 0.125 mg sublingual tablet, Place 0.125 mg under the tongue every 6 (six) hours as needed., Disp: , Rfl:  .  ibuprofen  (MOTRIN ) 800 mg tablet, Take 800 mg by mouth every 6 (six) hours as needed., Disp: , Rfl:  .  methocarbamoL  (ROBAXIN ) 500 mg tablet, Take 500 mg by mouth., Disp: , Rfl:  .  montelukast (Singulair) 10 mg tablet, Take 1 tablet (10 mg total) by mouth at bedtime., Disp: 90 tablet, Rfl: 3 .  omeprazole (PriLOSEC) 40 mg DR capsule, Take 40 mg by mouth daily as needed., Disp: 90 capsule, Rfl: 0 .  ondansetron  (ZOFRAN ) 4 mg tablet, Take 4 mg by mouth every 8 (eight) hours as needed for nausea., Disp: 20 tablet, Rfl: 0 .  potassium chloride (KLOR-CON) 20 mEq ER tablet, Take 20 mEq by mouth daily as needed., Disp: 30 tablet, Rfl: 3 .  rizatriptan (MAXALT) 10 mg tablet, Take 1 tablet (10 mg total) by mouth once as needed for migraine. May repeat in 2 hours if needed, Disp: 12 tablet, Rfl: 11 .  rosuvastatin  (CRESTOR ) 10 mg tablet, Take 1 tablet (10 mg total) by mouth daily., Disp: 90 tablet, Rfl: 1 .  terconazole (TERAZOL 7) 0.4 % crea vaginal cream, Insert 1 Applicatorful into the vagina daily., Disp: 45 g, Rfl: 0 .  triamcinolone  (KENALOG ) 0.1 % cream, Apply  topically 2 (two) times a day., Disp: 80 g, Rfl: 0 .  white petrolatum (Baby Skin Protectant, pet,) 41 % ointment, Apply  1 Application topically as needed., Disp: 7 g, Rfl: 0 .  meloxicam  (MOBIC ) 15 mg tablet, Take 1 tablet (15 mg total) by mouth daily. (Patient not taking: Reported on 01/20/2024), Disp: 30 tablet, Rfl: 0 .  meloxicam  (MOBIC ) 7.5 mg tablet, Take 1 tablet (7.5 mg total) by mouth daily for 10 days. (Patient not taking: Reported on 01/20/2024), Disp: 10 tablet, Rfl: 0 .  metroNIDAZOLE (FLAGYL) 500 mg tablet, Take 1 tablet (500 mg total) by mouth 2 (two) times a day for 7 days. (Patient not taking: Reported on 01/20/2024), Disp: 14 tablet, Rfl:  0    Objective:   BP 122/83   Pulse 69   Temp 97.1 F (36.2 C) (Temporal)   Ht 1.524 m (5')   Wt 74.4 kg (164 lb)   SpO2 98%   BMI 32.03 kg/m   Physical Exam Constitutional:      General: She is not in acute distress.    Appearance: She is not ill-appearing.  HENT:     Right Ear: Tympanic membrane normal.     Left Ear: Tympanic membrane normal.     Nose: No congestion or rhinorrhea.     Mouth/Throat:     Pharynx: No oropharyngeal exudate or posterior oropharyngeal erythema.  Eyes:     Conjunctiva/sclera: Conjunctivae normal.  Cardiovascular:     Rate and Rhythm: Normal rate and regular rhythm.  Pulmonary:     Effort: Pulmonary effort is normal.     Breath sounds: Normal breath sounds.  Abdominal:     General: Abdomen is flat. There is no distension.     Palpations: Abdomen is soft.     Tenderness: There is no abdominal tenderness. There is no right CVA tenderness, left CVA tenderness, guarding or rebound.  Skin:    Coloration: Skin is not jaundiced.  Neurological:     Mental Status: She is alert.  Psychiatric:        Mood and Affect: Mood normal.     Lab ( 10/15/23) CR: 0.60 GFR: >90 LFT: normal   Glucose: 81 A1c: 5.5 CBC + Diff: WNL      Assessment:     1. Body aches  POCT COVID BD   POC Urinalysis Auto without Microscopic         Plan:    Normal UA Rapid COVID test neg  She declined a PCR Discussed diagnosis and treatment of URI. Suggested symptomatic OTC remedies. Follow up as needed.

## 2024-01-25 NOTE — Progress Notes (Addendum)
 Bariatric Surgery 15 months postoperative Visit Note  Subjective Marissa Lin is a 53 y.o. female  presenting for follow up 6 months after I performed robotic sleeve gastrectomy. She is doing well, tolerating regular diet, having normal bowel function and ambulating. She is eating and drinking 64 ounces of fluids and 60 grams of protein. She is walking daily and doing resistance training. She has completed omeprazole and ursodiol as prescribed. She is taking multivitamin and calcium  as prescribed.  Early complaint was reflux, typically triggered by certain foods that she needs to do better controlling and she reports she is doing better at avoiding triggering foods.  Taking her PPI intermittently.  She returns saying that her reflux continues to bother her.  We do discuss that she has some compliance issues, eating chicken wings at night, sometimes potentially overeating, eating some acidic foods such as peppers and onions.  We discussed tightening up her diet, which she will.  However, she has complained about this reflux several times and I think this needs to be worked up.  We discussed referral to GI, endoscopy and upper GI as the start of the workup.  We discussed that if it is determined that she has pathologic reflux we may have to convert her to a Roux-en-Y gastric bypass which she understands.  Objective Ht 5' 0.43 (1.535 m)   Wt 163 lb 3.2 oz (74 kg)   BMI 31.42 kg/m   Body mass index is 31.42 kg/m.  Wt Readings from Last 3 Encounters:  01/25/24 163 lb 3.2 oz (74 kg)  12/15/23 167 lb 3.2 oz (75.8 kg)  10/29/23 167 lb (75.8 kg)    DOS weight: Preop weight 222 pounds, she was actually 230 pounds entered the program, today she is 163 pounds, BMI 31, excellent weight loss, congratulated her Abdomen: Soft, non tender, non distended  Comorbidity List After Surgery: Wn:Ipjazuzd No:Insulin  No:Non-Insulin  Bzd:HZMI requiring medicine  Bzd:Ybezmopepizfpj   Bzd:[id number] # or Medications ________ Wn:Dozze Apnea  Assessment and Plan 1. Gastroesophageal reflux disease, unspecified whether esophagitis present  Ambulatory Referral to Gastroenterology   FL Upper Gi Single Contrast    2. BMI 37.0-37.9, adult      3. Class 2 obesity      4. Hypertension, unspecified type      5. History of sleeve gastrectomy      6. Nutritional counseling         Marissa Lin is a 53 y.o. female is 15 months s/p robotic sleeve gastrectomy, doing well. She has lost 70 pounds pounds since surgery! This is in line with predicted weight loss.  Continued postop reflux, not taking her medication daily and also with some possible behavioral modifications needed   -She has made a lot of changes to her diet but still having some potential foods creeping in the diet.   - Having said that I think she has made several complaints enough that I think it warrants further workup.   - We discussed referral to GI, with endoscopy as well as upper GI to delineate extent of reflux.  If pathologic may need conversion to Roux-en-Y gastric bypass however I am hoping this can be treated with just behavioral modification.  We also discussed taking her PPI daily, every day, on empty stomach, 15 to 30 minutes prior to a meal.  Will make sure this is in the discharge as well.   - Once she sees GI, she will follow-up with me in 6 to 8 weeks and we  can decide about how her symptoms either are improving or worsening.  She is in agreement with this plan.

## 2024-02-08 NOTE — Progress Notes (Signed)
 Bariatric Surgery 15 months postoperative Visit Note  Subjective Marissa Lin is a 53 y.o. female  presenting for follow up over a year after sleeve gastrectomy.  She has been doing very well from a weight loss standpoint, working out.  Still making some good as well as some poor food choices which she continues to work on.  Trying to avoid triggering foods.  Still has reflux which is her biggest issue.  We discussed that she may need conversion to Roux-en-Y gastric bypass if she continues to have reflux but I suspect this is more behavioral.  We discussed eating healthy.  We discussed avoiding triggering foods.  I obtained an upper GI just to confirm normal anatomy and in fact she has normal anatomy, normal sleeve anatomy, no hiatal hernia no big reflux events despite multiple different positioning.  Thus further confirms my suspicion that this is more behavioral and as long as we can fix her diet, and over eating potentially, her symptoms are resolved.  She is seeing Dr. Melonie in the next several weeks with plan for upper endoscopy.  Objective Temp 97.5 F (36.4 C) (Temporal)   Ht 5' (1.524 m)   Wt 165 lb 9.6 oz (75.1 kg)   BMI 32.34 kg/m   Body mass index is 32.34 kg/m.  Wt Readings from Last 3 Encounters:  02/08/24 165 lb 9.6 oz (75.1 kg)  01/25/24 163 lb 3.2 oz (74 kg)  12/15/23 167 lb 3.2 oz (75.8 kg)    DOS weight: Preop weight 222 pounds, she was actually 230 pounds entered the program, today she is 165 pounds, BMI 32, excellent weight loss, congratulated her Abdomen: Soft, non tender, non distended  Comorbidity List After Surgery: Wn:Ipjazuzd No:Insulin  No:Non-Insulin  Bzd:HZMI requiring medicine  Bzd:Ybezmopepizfpj  Bzd:Ybezmuzwdpnw8 # or Medications ________ Wn:Dozze Apnea  Assessment and Plan 1. Gastroesophageal reflux disease, unspecified whether esophagitis present      2. BMI 37.0-37.9, adult      3. Class 2 obesity      4. History of sleeve  gastrectomy         Marissa Lin is a 54 y.o. female is 15 months s/p robotic sleeve gastrectomy, doing well. She has lost 70 pounds pounds since surgery! This is in line with predicted weight loss.  Continued postop reflux, now taking her medication daily, still having some reflux despite some changes to diet.  -She continues to make changes to her diet but still having some triggering foods into her diet. - I got an upper GI which shows normal anatomy, no reflux noted despite multiple positioning procedures, consistent with a normal LES.  Disc further confirms that this is most likely behavioral, and with dietary changes as well as avoiding overeating, these reflux should resolve. - We discussed conversion to a bypass only as a last resort, she understands and agrees.  Follow-up with GI and she can see me in 12 weeks after endoscopy and dietary changes to see how she is doing.

## 2024-02-12 ENCOUNTER — Encounter (HOSPITAL_COMMUNITY): Payer: Self-pay

## 2024-02-12 ENCOUNTER — Ambulatory Visit (HOSPITAL_COMMUNITY)
Admission: EM | Admit: 2024-02-12 | Discharge: 2024-02-12 | Disposition: A | Discharge status: Home / Self Care | Payer: Worker's Compensation | Attending: Physician Assistant | Admitting: Physician Assistant

## 2024-02-12 DIAGNOSIS — M546 Pain in thoracic spine: Secondary | ICD-10-CM | POA: Diagnosis not present

## 2024-02-12 MED ORDER — METHYLPREDNISOLONE SODIUM SUCC 125 MG IJ SOLR
125.0000 mg | Freq: Once | INTRAMUSCULAR | Status: AC
  Administered 2024-02-12: 125 mg via INTRAMUSCULAR

## 2024-02-12 MED ORDER — KETOROLAC TROMETHAMINE 30 MG/ML IJ SOLN
INTRAMUSCULAR | Status: AC
  Filled 2024-02-12: qty 1

## 2024-02-12 MED ORDER — KETOROLAC TROMETHAMINE 30 MG/ML IJ SOLN
30.0000 mg | Freq: Once | INTRAMUSCULAR | Status: AC
  Administered 2024-02-12: 30 mg via INTRAMUSCULAR

## 2024-02-12 MED ORDER — METHYLPREDNISOLONE SODIUM SUCC 125 MG IJ SOLR
INTRAMUSCULAR | Status: AC
  Filled 2024-02-12: qty 2
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
## 2024-02-12 NOTE — ED Triage Notes (Signed)
 Pt states WC, c/o upper back, lt shoulder, lt arm pain. States went to physical therapy yesterday. States now having lt lower back pain radiating to lt hip. Denies taking any meds. Denies new injury beside the fall in July 2025.

## 2024-02-12 NOTE — Discharge Instructions (Addendum)
 See your Orthopaedist for recheck next week as scheduled

## 2024-02-12 NOTE — ED Notes (Signed)
 Pt states had a MRI on lower back. Pt requesting xrays of everything that's hurting. Denies injury.

## 2024-03-16 ENCOUNTER — Encounter: Payer: Self-pay | Admitting: *Deleted

## 2024-03-16 DIAGNOSIS — R7301 Impaired fasting glucose: Secondary | ICD-10-CM | POA: Insufficient documentation

## 2024-03-16 DIAGNOSIS — G629 Polyneuropathy, unspecified: Secondary | ICD-10-CM | POA: Insufficient documentation

## 2024-03-18 ENCOUNTER — Ambulatory Visit: Admitting: Cardiology

## 2024-03-30 ENCOUNTER — Ambulatory Visit: Admitting: Cardiology

## 2024-04-04 ENCOUNTER — Ambulatory Visit: Attending: Cardiology | Admitting: Cardiology

## 2024-04-04 ENCOUNTER — Encounter: Payer: Self-pay | Admitting: Cardiology

## 2024-04-04 VITALS — BP 116/80 | HR 62 | Ht 60.0 in | Wt 164.6 lb

## 2024-04-04 DIAGNOSIS — K219 Gastro-esophageal reflux disease without esophagitis: Secondary | ICD-10-CM

## 2024-04-04 DIAGNOSIS — E782 Mixed hyperlipidemia: Secondary | ICD-10-CM

## 2024-04-04 DIAGNOSIS — I10 Essential (primary) hypertension: Secondary | ICD-10-CM | POA: Diagnosis not present

## 2024-04-04 DIAGNOSIS — E785 Hyperlipidemia, unspecified: Secondary | ICD-10-CM | POA: Diagnosis not present

## 2024-04-04 NOTE — Patient Instructions (Signed)
 Medication Instructions:  Your physician recommends that you continue on your current medications as directed. Please refer to the Current Medication list given to you today.  *If you need a refill on your cardiac medications before your next appointment, please call your pharmacy*   Lab Work: Your physician recommends that you return for lab work in: when fasting You need to have labs done when you are fasting.  You can come Monday through Friday 8:30 am to 12:00 pm and 1:15 to 4:30. You do not need to make an appointment as the order has already been placed.     Testing/Procedures: None Ordered   Follow-Up: At [facility], you and your health needs are our priority.  As part of our continuing mission to provide you with exceptional heart care, we have created designated Provider Care Teams.  These Care Teams include your primary Cardiologist (physician) and Advanced Practice Providers (APPs -  Physician Assistants and Nurse Practitioners) who all work together to provide you with the care you need, when you need it.  We recommend signing up for the patient portal called MyChart.  Sign up information is provided on this After Visit Summary.  MyChart is used to connect with patients for Virtual Visits (Telemedicine).  Patients are able to view lab/test results, encounter notes, upcoming appointments, etc.  Non-urgent messages can be sent to your provider as well.   To learn more about what you can do with MyChart, go to [email or web address].    Your next appointment:   12 month(s)  The format for your next appointment:   In Person  Provider:   Lamar Fitch, MD    Other Instructions NA
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
## 2024-04-13 ENCOUNTER — Encounter (HOSPITAL_COMMUNITY): Payer: Self-pay | Admitting: Emergency Medicine

## 2024-04-13 ENCOUNTER — Ambulatory Visit (HOSPITAL_COMMUNITY)
Admission: EM | Admit: 2024-04-13 | Discharge: 2024-04-13 | Disposition: A | Discharge status: Home / Self Care | Attending: Nurse Practitioner | Admitting: Nurse Practitioner

## 2024-04-13 DIAGNOSIS — J101 Influenza due to other identified influenza virus with other respiratory manifestations: Secondary | ICD-10-CM

## 2024-04-13 LAB — POCT INFLUENZA A/B
Influenza A, POC: POSITIVE — AB
Influenza B, POC: NEGATIVE

## 2024-04-13 LAB — POC SOFIA SARS ANTIGEN FIA: SARS Coronavirus 2 Ag: NEGATIVE

## 2024-04-13 MED ORDER — BENZONATATE 100 MG PO CAPS: 100.0000 mg | ORAL_CAPSULE | Freq: Three times a day (TID) | ORAL | 0 refills | Status: AC | PRN

## 2024-04-13 MED ORDER — ACETAMINOPHEN 325 MG PO TABS
ORAL_TABLET | ORAL | Status: AC
  Filled 2024-04-13: qty 3

## 2024-04-13 MED ORDER — ACETAMINOPHEN 325 MG PO TABS
975.0000 mg | ORAL_TABLET | Freq: Once | ORAL | Status: AC
  Administered 2024-04-13: 975 mg via ORAL

## 2024-04-13 MED ORDER — OSELTAMIVIR PHOSPHATE 75 MG PO CAPS: 75.0000 mg | ORAL_CAPSULE | Freq: Two times a day (BID) | ORAL | 0 refills | Status: AC
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
## 2024-04-13 NOTE — Discharge Instructions (Signed)
 You have influenza A.  Take the Tamiflu  twice daily for 5 days as prescribed to treat it..  Symptoms should improve over the next week to 10 days.  If you develop chest pain or shortness of breath, go to the emergency room.  Some things that can make you feel better are: - Increased rest - Increasing fluid with water/sugar free electrolytes - Acetaminophen  and ibuprofen  as needed for fever/pain - Salt water gargling, chloraseptic spray and throat lozenges for sore throat - OTC guaifenesin (Mucinex) 600 mg twice daily for congestion - Saline sinus flushes or a neti pot for congestion - Humidifying the air -Tessalon  Perles every 8 hours as needed for dry cough

## 2024-04-13 NOTE — ED Triage Notes (Signed)
 Pt reports yesterday wore a mask at work and started having headaches, back pain on left side. Reports gets dizzy with standing. Pt reports Tuesday started having tickle in throat and got a cough. Had flu shot on Friday. Denies taking any medications for symptoms.
